# Patient Record
Sex: Male | Born: 1990 | Race: Black or African American | Hispanic: No | Marital: Single | Smoking: Never smoker
Health system: Southern US, Community
[De-identification: ages and names within clinical notes are randomized; demographics above are authoritative.]

## PROBLEM LIST (undated history)

## (undated) VITALS — BP 120/76 | HR 110 | Temp 100.7°F | Resp 16 | Ht 66.0 in | Wt 183.0 lb

## (undated) DIAGNOSIS — F445 Conversion disorder with seizures or convulsions: Secondary | ICD-10-CM

## (undated) DIAGNOSIS — F32A Depression, unspecified: Secondary | ICD-10-CM

## (undated) DIAGNOSIS — K589 Irritable bowel syndrome without diarrhea: Secondary | ICD-10-CM

## (undated) DIAGNOSIS — R55 Syncope and collapse: Secondary | ICD-10-CM

## (undated) DIAGNOSIS — F329 Major depressive disorder, single episode, unspecified: Secondary | ICD-10-CM

## (undated) DIAGNOSIS — F319 Bipolar disorder, unspecified: Secondary | ICD-10-CM

## (undated) DIAGNOSIS — F419 Anxiety disorder, unspecified: Secondary | ICD-10-CM

## (undated) DIAGNOSIS — F988 Other specified behavioral and emotional disorders with onset usually occurring in childhood and adolescence: Secondary | ICD-10-CM

## (undated) HISTORY — DX: Bipolar disorder, unspecified: F31.9

## (undated) HISTORY — PX: OTHER SURGICAL HISTORY: SHX169

## (undated) HISTORY — PX: SHOULDER SURGERY: SHX246

---
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
## 2009-04-12 ENCOUNTER — Emergency Department (HOSPITAL_COMMUNITY): Admission: EM | Admit: 2009-04-12 | Discharge: 2009-04-12 | Payer: Self-pay | Admitting: Emergency Medicine

## 2009-04-15 ENCOUNTER — Encounter: Payer: Self-pay | Admitting: Physician Assistant

## 2009-04-15 ENCOUNTER — Ambulatory Visit: Payer: Self-pay | Admitting: Cardiology

## 2009-04-16 ENCOUNTER — Encounter: Payer: Self-pay | Admitting: Cardiology

## 2009-04-25 ENCOUNTER — Emergency Department (HOSPITAL_COMMUNITY): Admission: EM | Admit: 2009-04-25 | Discharge: 2009-04-25 | Payer: Self-pay | Admitting: Emergency Medicine

## 2009-04-26 DIAGNOSIS — R55 Syncope and collapse: Secondary | ICD-10-CM | POA: Insufficient documentation

## 2009-04-27 ENCOUNTER — Ambulatory Visit: Payer: Self-pay | Admitting: Internal Medicine

## 2009-05-23 ENCOUNTER — Inpatient Hospital Stay (HOSPITAL_COMMUNITY): Admission: EM | Admit: 2009-05-23 | Discharge: 2009-05-28 | Payer: Self-pay | Admitting: Emergency Medicine

## 2009-05-23 ENCOUNTER — Ambulatory Visit: Payer: Self-pay | Admitting: Internal Medicine

## 2009-05-25 ENCOUNTER — Encounter (INDEPENDENT_AMBULATORY_CARE_PROVIDER_SITE_OTHER): Payer: Self-pay | Admitting: Neurology

## 2009-06-09 ENCOUNTER — Ambulatory Visit (HOSPITAL_COMMUNITY): Admission: RE | Admit: 2009-06-09 | Discharge: 2009-06-09 | Payer: Self-pay | Admitting: Internal Medicine

## 2009-09-28 ENCOUNTER — Telehealth (INDEPENDENT_AMBULATORY_CARE_PROVIDER_SITE_OTHER): Payer: Self-pay | Admitting: *Deleted

## 2009-10-03 ENCOUNTER — Emergency Department (HOSPITAL_COMMUNITY): Admission: EM | Admit: 2009-10-03 | Discharge: 2009-10-03 | Payer: Self-pay | Admitting: Emergency Medicine

## 2009-11-25 ENCOUNTER — Emergency Department (HOSPITAL_COMMUNITY): Admission: EM | Admit: 2009-11-25 | Discharge: 2009-11-26 | Payer: Self-pay | Admitting: Emergency Medicine

## 2009-11-26 ENCOUNTER — Inpatient Hospital Stay (HOSPITAL_COMMUNITY): Admission: AD | Admit: 2009-11-26 | Discharge: 2009-11-28 | Payer: Self-pay | Admitting: Psychiatry

## 2009-11-26 ENCOUNTER — Ambulatory Visit: Payer: Self-pay | Admitting: Psychiatry

## 2010-01-07 ENCOUNTER — Telehealth (INDEPENDENT_AMBULATORY_CARE_PROVIDER_SITE_OTHER): Payer: Self-pay | Admitting: *Deleted

## 2010-01-10 ENCOUNTER — Ambulatory Visit: Payer: Self-pay | Admitting: Internal Medicine

## 2010-01-10 ENCOUNTER — Telehealth: Payer: Self-pay | Admitting: Internal Medicine

## 2010-01-10 DIAGNOSIS — F329 Major depressive disorder, single episode, unspecified: Secondary | ICD-10-CM | POA: Insufficient documentation

## 2010-01-10 DIAGNOSIS — J309 Allergic rhinitis, unspecified: Secondary | ICD-10-CM | POA: Insufficient documentation

## 2010-01-10 DIAGNOSIS — F411 Generalized anxiety disorder: Secondary | ICD-10-CM | POA: Insufficient documentation

## 2010-01-10 DIAGNOSIS — G4700 Insomnia, unspecified: Secondary | ICD-10-CM | POA: Insufficient documentation

## 2010-01-10 DIAGNOSIS — M25519 Pain in unspecified shoulder: Secondary | ICD-10-CM | POA: Insufficient documentation

## 2010-01-10 LAB — CONVERTED CEMR LAB
ALT: 121 units/L — ABNORMAL HIGH (ref 0–53)
Albumin: 4.3 g/dL (ref 3.5–5.2)
Basophils Relative: 0.2 % (ref 0.0–3.0)
CO2: 32 meq/L (ref 19–32)
Calcium: 10.3 mg/dL (ref 8.4–10.5)
Chloride: 100 meq/L (ref 96–112)
Creatinine, Ser: 0.8 mg/dL (ref 0.4–1.5)
Eosinophils Relative: 2.1 % (ref 0.0–5.0)
LDL Cholesterol: 88 mg/dL (ref 0–99)
Lymphocytes Relative: 52.6 % — ABNORMAL HIGH (ref 12.0–46.0)
MCHC: 35.3 g/dL (ref 30.0–36.0)
Neutro Abs: 1.5 10*3/uL (ref 1.4–7.7)
Nitrite: NEGATIVE
RBC: 5.09 M/uL (ref 4.22–5.81)
Sodium: 140 meq/L (ref 135–145)
Specific Gravity, Urine: 1.02 (ref 1.000–1.030)
Total Bilirubin: 0.4 mg/dL (ref 0.3–1.2)
Total CHOL/HDL Ratio: 3
Total Protein, Urine: NEGATIVE mg/dL
Urine Glucose: NEGATIVE mg/dL
Urobilinogen, UA: 0.2 (ref 0.0–1.0)
VLDL: 17.8 mg/dL (ref 0.0–40.0)
WBC: 4.4 10*3/uL — ABNORMAL LOW (ref 4.5–10.5)
pH: 8 (ref 5.0–8.0)

## 2010-01-18 ENCOUNTER — Telehealth (INDEPENDENT_AMBULATORY_CARE_PROVIDER_SITE_OTHER): Payer: Self-pay | Admitting: *Deleted

## 2010-01-24 ENCOUNTER — Ambulatory Visit: Payer: Self-pay | Admitting: Professional

## 2010-01-25 ENCOUNTER — Telehealth: Payer: Self-pay | Admitting: Internal Medicine

## 2010-02-14 ENCOUNTER — Telehealth: Payer: Self-pay | Admitting: Internal Medicine

## 2010-03-06 ENCOUNTER — Emergency Department (HOSPITAL_COMMUNITY): Admission: EM | Admit: 2010-03-06 | Discharge: 2010-03-06 | Payer: Self-pay | Admitting: Emergency Medicine

## 2010-03-29 ENCOUNTER — Telehealth (INDEPENDENT_AMBULATORY_CARE_PROVIDER_SITE_OTHER): Payer: Self-pay | Admitting: *Deleted

## 2010-03-30 ENCOUNTER — Emergency Department (HOSPITAL_COMMUNITY): Admission: EM | Admit: 2010-03-30 | Discharge: 2010-03-30 | Payer: Self-pay | Admitting: Emergency Medicine

## 2010-04-04 ENCOUNTER — Telehealth: Payer: Self-pay | Admitting: Internal Medicine

## 2010-04-21 ENCOUNTER — Telehealth: Payer: Self-pay | Admitting: Internal Medicine

## 2010-04-23 ENCOUNTER — Emergency Department (HOSPITAL_COMMUNITY): Admission: EM | Admit: 2010-04-23 | Discharge: 2010-04-23 | Payer: Self-pay | Admitting: Emergency Medicine

## 2010-05-01 ENCOUNTER — Emergency Department (HOSPITAL_COMMUNITY)
Admission: EM | Admit: 2010-05-01 | Discharge: 2010-05-01 | Payer: Self-pay | Source: Home / Self Care | Admitting: Emergency Medicine

## 2010-05-30 ENCOUNTER — Telehealth: Payer: Self-pay | Admitting: Internal Medicine

## 2010-08-10 ENCOUNTER — Emergency Department (HOSPITAL_COMMUNITY)
Admission: EM | Admit: 2010-08-10 | Discharge: 2010-08-10 | Payer: Self-pay | Source: Home / Self Care | Admitting: Emergency Medicine

## 2010-08-11 ENCOUNTER — Telehealth: Payer: Self-pay | Admitting: Internal Medicine

## 2010-08-12 ENCOUNTER — Ambulatory Visit: Payer: Self-pay | Admitting: Internal Medicine

## 2010-08-15 ENCOUNTER — Telehealth: Payer: Self-pay | Admitting: Internal Medicine

## 2010-08-17 ENCOUNTER — Ambulatory Visit: Payer: Self-pay | Admitting: Internal Medicine

## 2010-08-17 DIAGNOSIS — M62838 Other muscle spasm: Secondary | ICD-10-CM | POA: Insufficient documentation

## 2010-08-24 ENCOUNTER — Encounter: Payer: Self-pay | Admitting: Internal Medicine

## 2010-08-26 ENCOUNTER — Telehealth: Payer: Self-pay | Admitting: Internal Medicine

## 2010-08-31 ENCOUNTER — Encounter: Payer: Self-pay | Admitting: Internal Medicine

## 2010-09-01 ENCOUNTER — Encounter: Payer: Self-pay | Admitting: Internal Medicine

## 2010-09-06 ENCOUNTER — Encounter: Payer: Self-pay | Admitting: Internal Medicine

## 2010-10-02 ENCOUNTER — Emergency Department (HOSPITAL_COMMUNITY)
Admission: EM | Admit: 2010-10-02 | Discharge: 2010-10-02 | Payer: Self-pay | Source: Home / Self Care | Admitting: Emergency Medicine

## 2010-10-02 LAB — CBC
MCH: 30.5 pg (ref 26.0–34.0)
MCV: 86 fL (ref 78.0–100.0)
RDW: 12.8 % (ref 11.5–15.5)

## 2010-10-02 LAB — BASIC METABOLIC PANEL
BUN: 14 mg/dL (ref 6–23)
Chloride: 105 mEq/L (ref 96–112)
Creatinine, Ser: 0.91 mg/dL (ref 0.4–1.5)
GFR calc Af Amer: >60 mL/min (ref >60)
Glucose, Bld: 111 mg/dL — ABNORMAL HIGH (ref 70–99)
Potassium: 4.1 mEq/L (ref 3.5–5.1)
Sodium: 140 mEq/L (ref 135–145)

## 2010-10-02 LAB — DIFFERENTIAL
Basophils Absolute: 0 10*3/uL (ref 0.0–0.1)
Monocytes Absolute: 0.5 10*3/uL (ref 0.1–1.0)
Monocytes Relative: 8 % (ref 3–12)
Neutrophils Relative %: 52 % (ref 43–77)

## 2010-10-02 LAB — URINALYSIS, ROUTINE W REFLEX MICROSCOPIC
Hgb urine dipstick: NEGATIVE
Ketones, ur: NEGATIVE mg/dL

## 2010-10-02 LAB — RAPID URINE DRUG SCREEN, HOSP PERFORMED
Amphetamines: NOT DETECTED
Barbiturates: NOT DETECTED
Benzodiazepines: POSITIVE — AB
Cocaine: NOT DETECTED
Tetrahydrocannabinol: NOT DETECTED

## 2010-10-03 ENCOUNTER — Encounter: Payer: Self-pay | Admitting: Internal Medicine

## 2010-10-04 NOTE — Progress Notes (Signed)
Summary: Celexa refill  Phone Note Call from Patient   Caller: Mom [phone] Summary of Call: pt's mother called requesting Rx for Celexa. Per mom, pt will not get an appt with Psychiatry or Psychologist for at least 30 days and will run out by then. Initial call taken by: Margaret Pyle, CMA,  Jan 10, 2010 11:39 AM  Follow-up for Phone Call        ok for rx - to robin to handle Follow-up by: Corwin Levins MD,  Jan 10, 2010 12:51 PM    Prescriptions: CITALOPRAM HYDROBROMIDE 20 MG TABS (CITALOPRAM HYDROBROMIDE) 1po once daily  #30 x 0   Entered by:   Scharlene Gloss   Authorized by:   Corwin Levins MD   Signed by:   Scharlene Gloss on 01/10/2010   Method used:   [facility] ...       [facility]  [address] [id number]       [address]       [place], [place]  [address]       Ph: [phone]       Fax: [phone]   RxID:   [id number]

## 2010-10-04 NOTE — Progress Notes (Signed)
  Phone Note Other Incoming   Request: Send information Summary of Call: Request received from [facility] forwarded to [employer].       

## 2010-10-04 NOTE — Progress Notes (Signed)
  Phone Note Other Incoming   Request: Send information Summary of Call: Request for records received from [facility]. Request forwarded to [employer].     

## 2010-10-04 NOTE — Progress Notes (Signed)
Summary: PA, RXd at ER  Phone Note Call from Patient Call back at [facility] Phone [phone]   Caller: Yolanda--[phone] Call For: Brandon Levins MD Summary of Call: Pt seen at [facility] for seizures.They prescribed Ambien CR for sleep. In order for it to be filled it has to have prior auth. Pt cannot bring pt in for appt 12/19 for post hospital f/u. Mom wants to know if he Dr Jonny Ruiz can authorize this if not work in today for appt. Initial call taken by: Verdell Face,  August 11, 2010 10:59 AM  Follow-up for Phone Call        ok to start the PA for this Follow-up by: Brandon Levins MD,  August 11, 2010 12:07 PM  Additional Follow-up for Phone Call Additional follow up Details #1::        Dr Jonny Ruiz, i can not start a PA on a prescription that you  did not prescribe for patient. Additional Follow-up by: Dagoberto Reef,  August 11, 2010 2:44 PM    Additional Follow-up for Phone Call Additional follow up Details #2::    done hardcopy to LIM side B - dahlia  Follow-up by: Brandon Levins MD,  August 11, 2010 4:17 PM  Additional Follow-up for Phone Call Additional follow up Details #3:: Details for Additional Follow-up Action Taken: After advising JWJ that pt's mother verified that she will bring pt in for appt sched for 12/09 at 12noon, MD decided to wait on faxing in Rx to pt's pharmacy until eval at [facility]. Additional Follow-up by: Margaret Pyle, CMA,  August 11, 2010 4:55 PM  New/Updated Medications: AMBIEN CR 12.5 MG CR-TABS (ZOLPIDEM TARTRATE) 1po at bedtime as needed Prescriptions: AMBIEN CR 12.5 MG CR-TABS (ZOLPIDEM TARTRATE) 1po at bedtime as needed  #30 x 5   Entered and Authorized by:   Brandon Levins MD   Signed by:   Brandon Levins MD on 08/11/2010   Method used:   Print then Give to Patient   RxID:   [id number]

## 2010-10-04 NOTE — Progress Notes (Signed)
  Phone Note Refill Request Message from:  Fax from Pharmacy on April 04, 2010 9:55 AM  Refills Requested: Medication #1:  ABILIFY 5 MG TABS 1 by mouth once daily   Dosage confirmed as above?Dosage Confirmed   Last Refilled: 01/10/2010   Notes: [facility] [address] [place], [facility]OX#[phone] Initial call taken by: Robin Ewing CMA Duncan Dull),  April 04, 2010 9:55 AM    Prescriptions: ABILIFY 5 MG TABS (ARIPIPRAZOLE) 1 by mouth once daily  #30 x 3   Entered by:   Scharlene Gloss CMA (AAMA)   Authorized by:   Corwin Levins MD   Signed by:   Scharlene Gloss CMA (AAMA) on 04/04/2010   Method used:   F[facility]ed to ...       [facility]  [address] [id number]ail[facility]       [address]       [place], [place]  [address]       Ph: [phone] or [phone]       Fax: [phone]   RxID:   [id number]

## 2010-10-04 NOTE — Progress Notes (Signed)
Summary: 90 day Rx req  Phone Note Refill Request Message from:  Pharmacy on April 04, 2010 1:23 PM  Refills Requested: Medication #1:  ABILIFY 5 MG TABS 1 by mouth once daily   Dosage confirmed as above?Dosage Confirmed   Supply Requested: 9 months Pharmacy called stating that pt's insurance will only pay for 90 day supply of this medication.   Method Requested: Electronic Initial call taken by: Margaret Pyle, CMA,  April 04, 2010 1:24 PM    Prescriptions: ABILIFY 5 MG TABS (ARIPIPRAZOLE) 1 by mouth once daily  #90 x 2   Entered by:   Margaret Pyle, CMA   Authorized by:   Corwin Levins MD   Signed by:   Margaret Pyle, CMA on 04/04/2010   Method used:   [facility]ronically to        [facility]  [address] [id number]retail[facility]       [address]       [place], [place]  [address]       Ph: [phone] or [phone]       Fax: [phone]   RxID:   [id number]

## 2010-10-04 NOTE — Assessment & Plan Note (Signed)
Summary: NEW [employer] PT--PKG--#--STC   Vital Signs:  Patient profile:   20 year old male Height:      66 inches Weight:      184.75 pounds BMI:     29.93 O2 Sat:      98 % on Room air Temp:     97.8 degrees F oral Pulse rate:   63 / minute BP sitting:   100 / 62  (left arm) Cuff size:   regular  Vitals Entered ByZella Ball Ewing (Jan 10, 2010 9:49 AM)  O2 Flow:  Room air CC: New Patient, New BCBS/RE   Primary Care Provider:  Dr.Tapper  CC:  New Patient and New BCBS/RE.  History of Present Illness: after seeing neurology [place], was rec'd to psychiatry and psychology, but needs referrals;  takes up tot 2 trazodone per night but still not working well;  celexa "works sometimes"   - has good compliance but tends to still have significant depressive symtpoms later in the day; not sleepy later in the day;  tends to stay up most nights to 6 am, and sleeps until 11am;  Pt denies CP, sob, doe, wheezing, orthopnea, pnd, worsening LE edema, palps, dizziness or syncope  Pt denies new neuro symptoms such as headache, facial or extremity weakness  Still has some mild bilat shoudler aches .    Problems Prior to Update: 1)  Preventive Health Care  (ICD-V70.0) 2)  Insomnia-sleep Disorder-unspec  (ICD-780.52) 3)  Shoulder Pain, Bilateral  (ICD-719.41) 4)  Anxiety  (ICD-300.00) 5)  Allergic Rhinitis  (ICD-477.9) 6)  Depression  (ICD-311) 7)  Syncope  (ICD-780.2)  Medications Prior to Update: 1)  None 2)  Tramadol Hcl 50 Mg Tabs (Tramadol Hcl) .... As Needed  Current Medications (verified): 1)  None 2)  Citalopram Hydrobromide 20 Mg Tabs (Citalopram Hydrobromide) .Marland Kitchen.. 1po Once Daily 3)  Abilify 5 Mg Tabs (Aripiprazole) .Marland Kitchen.. 1 By Mouth Once Daily 4)  Zolpidem Tartrate 10 Mg Tabs (Zolpidem Tartrate) .Marland Kitchen.. 1 By Mouth At Bedtime As Needed 5)  Meloxicam 15 Mg Tabs (Meloxicam) .Marland Kitchen.. 1 By Mouth Once Daily As Needed  Allergies (verified): No Known Drug Allergies  Past History:  Family  History: Last updated: 04/27/2009 No premature coronary disease or sudden death.  Both parents are alive and well.  Social History: Last updated: 01/10/2010 Tobacco Use - No.  Alcohol Use - no Drug Use - no lives with mother work  - unemployed taking online classes  Risk Factors: Smoking Status: never (04/26/2009)  Past Medical History: SYNCOPE (ICD-780.2) x 2 - aug 2010 ? 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 Right Shoulder Surgery r/t mult dislocations  Family History: Reviewed history from 04/27/2009 and no changes required. No premature coronary disease or sudden death.  Both parents are alive and well.  Social History: Reviewed history from 04/26/2009 and no changes required. Tobacco Use - No.  Alcohol Use - no Drug Use - no lives with mother work  - unemployed taking online classes  Review of Systems  The patient denies anorexia, fever, weight loss, weight gain, vision loss, decreased hearing, hoarseness, chest pain, syncope, dyspnea on exertion, peripheral edema, prolonged cough, headaches, hemoptysis, abdominal pain, melena, hematochezia, severe indigestion/heartburn, hematuria, muscle weakness, suspicious skin lesions, transient blindness, difficulty walking, depression, unusual weight change, abnormal  bleeding, enlarged lymph nodes, and angioedema.         all otherwise negative per pt -    Physical Exam  General:  alert and overweight-appearing - mild, but also musclar/athletic Head:  normocephalic and atraumatic.   Eyes:  vision grossly intact, pupils equal, and pupils round.   Ears:  R ear normal and L ear normal.   Nose:  no external deformity and no nasal discharge.   Mouth:  no gingival abnormalities and pharynx pink and  moist.   Neck:  supple and no masses.   Lungs:  normal respiratory effort and normal breath sounds.   Heart:  normal rate and regular rhythm.   Abdomen:  soft, non-tender, and normal bowel sounds.   Msk:  no joint tenderness and no joint swelling.   Extremities:  no edema, no erythema  Neurologic:  cranial nerves II-XII intact and strength normal in all extremities.     Impression & Recommendations:  Problem # 1:  Preventive Health Care (ICD-V70.0)  Overall doing well, age appropriate education and counseling updated and referral for appropriate preventive services done unless declined, immunizations up to date or declined, diet counseling done if overweight, urged to quit smoking if smokes , most recent labs reviewed and current ordered if appropriate, ecg reviewed or declined (interpretation per ECG scanned in the EMR if done); information regarding Medicare Prevention requirements given if appropriate; speciality referrals updated as appropriate   Orders: TLB-BMP (Basic Metabolic Panel-BMET) (80048-METABOL) TLB-CBC Platelet - w/Differential (85025-CBCD) TLB-Hepatic/Liver Function Pnl (80076-HEPATIC) TLB-Lipid Panel (80061-LIPID) TLB-TSH (Thyroid Stimulating Hormone) (84443-TSH) TLB-Udip ONLY (81003-UDIP)  Problem # 2:  SHOULDER PAIN, BILATERAL (ICD-719.41)  The following medications were removed from the medication list:    Tramadol Hcl 50 Mg Tabs (Tramadol hcl) .Marland Kitchen... As needed His updated medication list for this problem includes:    Meloxicam 15 Mg Tabs (Meloxicam) .Marland Kitchen... 1 by mouth once daily as needed chronic bilat, right > left - for nsaid as needed   Problem # 3:  DEPRESSION (ICD-311)  The following medications were removed from the medication list:    Trazodone Hcl 50 Mg Tabs (Trazodone hcl) .Marland Kitchen... 1 by mouth at bedtime His updated medication list for this problem includes:    Citalopram Hydrobromide 20 Mg Tabs (Citalopram hydrobromide) .Marland Kitchen... 1po once  daily  Orders: Psychology Referral (Psychology) Psychiatric Referral (Psych)  Problem # 4:  INSOMNIA-SLEEP DISORDER-UNSPEC (ICD-780.52)  for ambien as needed   His updated medication list for this problem includes:    Zolpidem Tartrate 10 Mg Tabs (Zolpidem tartrate) .Marland Kitchen... 1 by mouth at bedtime as needed  Complete Medication List: 1)  None  2)  Citalopram Hydrobromide 20 Mg Tabs (Citalopram hydrobromide) .Marland Kitchen.. 1po once daily 3)  Abilify 5 Mg Tabs (Aripiprazole) .Marland Kitchen.. 1 by mouth once daily 4)  Zolpidem Tartrate 10 Mg Tabs (Zolpidem tartrate) .Marland Kitchen.. 1 by mouth at bedtime as needed 5)  Meloxicam 15 Mg Tabs (Meloxicam) .Marland Kitchen.. 1 by mouth once daily as needed  Patient Instructions: 1)  stop the trazodone 2)  start the generic ambien for sleep as needed 3)  start the meloxicam for pain as needed  4)  start the abilify for depression - remember only take HALF of the pill per day 5)  Continue all previous medications as before this visit  6)  You will be contacted about the referral(s) to: psychology and psychiatry 7)  Please schedule a follow-up appointment in 1 year or sooner if needed Prescriptions: MELOXICAM 15 MG TABS (  MELOXICAM) 1 by mouth once daily as needed  #30 x 11   Entered and Authorized by:   Corwin Levins MD   Signed by:   Corwin Levins MD on 01/10/2010   Method used:   Print then Give to Patient   RxID:   [id number] ZOLPIDEM TARTRATE 10 MG TABS (ZOLPIDEM TARTRATE) 1 by mouth at bedtime as needed  #30 x 5   Entered and Authorized by:   Corwin Levins MD   Signed by:   Corwin Levins MD on 01/10/2010   Method used:   Print then Give to Patient   RxID:   [id number] ABILIFY 5 MG TABS (ARIPIPRAZOLE) 1 by mouth once daily  #30 x 3   Entered and Authorized by:   Corwin Levins MD   Signed by:   Corwin Levins MD on 01/10/2010   Method used:   Print then Give to Patient   RxID:   [id number]

## 2010-10-04 NOTE — Progress Notes (Signed)
  Phone Note From Pharmacy   [facility]  [address] [id number] Summary of Call: Pharmacy received prescription for Citalopram 20mg  #30 with 5 refills on 02/14/2010. Insurance requires 90 day supply, they need a new rx. Initial call taken by: Robin Ewing CMA Duncan Dull),  April 21, 2010 1:39 PM  Follow-up for Phone Call        ok to change to 90 days rx - to robin to handle Follow-up by: Corwin Levins MD,  April 21, 2010 1:50 PM    Prescriptions: CITALOPRAM HYDROBROMIDE 20 MG TABS (CITALOPRAM HYDROBROMIDE) 1po once daily  #90 x 2   Entered by:   Scharlene Gloss CMA (AAMA)   Authorized by:   Corwin Levins MD   Signed by:   Scharlene Gloss CMA (AAMA) on 04/21/2010   Method used:   Faxed to ...       [facility]  [address] [id number]ail)       [address]       [place], [place]  [address]       Ph: [phone] or [phone]       Fax: [phone]   RxID:   [id number]

## 2010-10-04 NOTE — Progress Notes (Signed)
Summary: Referral/Medication  Phone Note Call from Patient   Caller: Argentina Ponder [phone] Summary of Call: pt's mother called stating that she was unsatisfied with Psychology appt yesterday 01/24/2010 with Dr. Luiz Blare. Pt's mother says that Dr feel asleep several times during visit and continued to repeat himself. Pt and mother are requesting referral to different MD. Mother also states that pt has been doing "things" at night that he does not remember since starting Ambien, she is requesting alt medication. 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 Additional Follow-up by: Margaret Pyle, CMA,  Jan 25, 2010 2:58 PM    Additional Follow-up for Phone Call Additional follow up Details #2::    ok to try temazepam  - done hardcopy to LIM side B - dahlia  Follow-up by: Corwin Levins MD,  Jan 26, 2010 5:22 PM  Additional Follow-up for Phone Call Additional follow up Details #3:: Details for Additional Follow-up Action Taken: Rx in cabinet for pt pick up. Pt's mother informed Additional Follow-up by: Margaret Pyle, CMA,  Jan 27, 2010 8:06 AM  New/Updated Medications: TRAZODONE HCL 50 MG TABS (TRAZODONE HCL) 1 by mouth at bedtime as needed sleep TEMAZEPAM 30 MG CAPS (TEMAZEPAM) 1po at bedtime as needed Prescriptions: TEMAZEPAM 30 MG CAPS (TEMAZEPAM) 1po at bedtime as needed  #30 x 2   Entered and Authorized by:   Corwin Levins MD   Signed by:   Corwin Levins MD on 01/26/2010   Method used:   Print then Give  to Patient   RxID:   [id number] TRAZODONE HCL 50 MG TABS (TRAZODONE HCL) 1 by mouth at bedtime as needed sleep  #30 x 5   Entered and Authorized by:   Corwin Levins MD   Signed by:   Corwin Levins MD on 01/25/2010   Method used:   Print then Give to Patient   RxID:   [id number]  done hardcopy to LIM side B - dahlia Corwin Levins MD  Jan 25, 2010 2:05 PM

## 2010-10-04 NOTE — Progress Notes (Signed)
Summary: medication refill  Phone Note Refill Request Message from:  Fax from Pharmacy on May 30, 2010 9:43 AM  Refills Requested: Medication #1:  TEMAZEPAM 30 MG CAPS 1po at bedtime as needed   Dosage confirmed as above?Dosage Confirmed   Last Refilled: 01/26/2010   Notes: [facility] [address] [place], [facility]NA[facility][phone] Initial call taken by: Robin Ewing CMA Duncan Dull),  May 30, 2010 9:44 AM    Prescriptions: TEMAZEPAM 30 MG CAPS (TEMAZEPAM) 1po at bedtime as needed  #30 x 2   Entered and Authorized by:   Corwin Levins MD   Signed by:   Corwin Levins MD on 05/30/2010   Method used:   Print then Give to Patient   RxID:   [id number]  done hardcopy to LIM side B - dahlia Corwin Levins MD  May 30, 2010 2:41 PM   Rx faxed to pharmacy Margaret Pyle, CMA  May 30, 2010 2:55 PM

## 2010-10-04 NOTE — Progress Notes (Signed)
Summary: Rx refill req  Phone Note Call from Patient   Caller: Mom [phone] Summary of Call: pt's mother called requesting [facility]ills of [facility]lexa [facility] Initial call taken by: Margaret Pyle, CMA,  February 14, 2010 11:37 AM    Prescriptions: CITALOPRAM HYDROBROMIDE 20 MG TABS (CITALOPRAM HYDROBROMIDE) 1po once daily  #30 x 5   Entered by:   Margaret Pyle, CMA   Authorized by:   Corwin Levins MD   Signed by:   Margaret Pyle, CMA on 02/14/2010   Method used:   Electronically to        [facility]  [address] [facility] (retail)       [address]       [place], [place]  [address]       Ph: [phone] or [phone]       Fax: [phone]   RxID:   [id number]

## 2010-10-04 NOTE — Progress Notes (Signed)
  Request for Records from [facility] forwarded to [facility] for processing [facility]  September 28, 2009 11:49 AM

## 2010-10-06 NOTE — Letter (Signed)
Summary: [facility]  [facility]   Imported By: Lennie Odor 09/12/2010 11:14:29  _____________________________________________________________________  External Attachment:    Type:   Image     Comment:   External Document

## 2010-10-06 NOTE — Medication Information (Signed)
Summary: Ambien CR approved/[facility]  Ambien CR approved/[facility]   Imported By: Sherian Rein 09/07/2010 09:55:17  _____________________________________________________________________  External Attachment:    Type:   Image     Comment:   External Document

## 2010-10-06 NOTE — Progress Notes (Signed)
Summary: PA-Zolpidem  Phone Note From Pharmacy   Call[facility]: [facility]  [address] [phone]* Summary of Call: PA -Zolpidem Called [facility] @ [phone], received form. form to Dr Jonny Ruiz to complete. Dagoberto Reef  August 26, 2010 2:26 PM   Follow-up for Phone Call        form completed and faxed to [facility] Follow-up by: Margaret Pyle, CMA,  August 31, 2010 2:15 PM  Additional Follow-up for Phone Call Additional follow up Details #1::        PA Approved 09/01/2010 -  03/03/2011 Additional Follow-up by: Margaret Pyle, CMA,  September 01, 2010 8:39 AM

## 2010-10-06 NOTE — Medication Information (Signed)
Summary: Ambien/[facility]  Ambien/[facility]   Imported By: Sherian Rein 09/06/2010 12:21:25  _____________________________________________________________________  External Attachment:    Type:   Image     Comment:   External Document

## 2010-10-06 NOTE — Letter (Signed)
Summary: [facility]  [facility]   Imported By: Lennie Odor 09/13/2010 12:09:13  _____________________________________________________________________  External Attachment:    Type:   Image     Comment:   External Document
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
## 2010-10-06 NOTE — Letter (Signed)
Summary: [facility]  [facility]   Imported By: Lennie Odor 09/28/2010 15:14:57  _____________________________________________________________________  External Attachment:    Type:   Image     Comment:   External Document

## 2010-10-06 NOTE — Progress Notes (Signed)
Summary: Ambien PA  Phone Note Call from Patient   Caller: Mom Summary of Call: Pt's mother called stating pt is Inpt at [facility] and will not be able to keep re-schd appt for tomorrow. Pt's mother did request PA be done on Ambien for pt ASAP because she was advised in hospital by nurse that Ambien 10mg  is not availbale (pt is being given Ambien 5mg ) but pt can bring in his own medicine and take with meds given at hospital. I advised pt's mother that MD decided to hold off on PA because pt was scheduled for appt and if he is a pt at [facility] she should advise attending MD that he is not resting with current Ambien dosage. Mother agreed and will call back to sch pt to have post-hosp once released. Initial call taken by: Margaret Pyle, CMA,  August 15, 2010 4:45 PM
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
## 2010-10-25 ENCOUNTER — Encounter: Payer: Self-pay | Admitting: Internal Medicine

## 2010-11-01 NOTE — Consult Note (Signed)
Summary: [facility] / [facility]  [facility] / [facility]   Imported By: Lennie Odor 10/25/2010 10:20:10  _____________________________________________________________________  External Attachment:    Type:   Image     Comment:   External Document

## 2010-11-10 NOTE — Letter (Signed)
Summary: Cardiology/[facility]  Cardiology/[facility]   Imported By: Sherian Rein 11/02/2010 08:50:09  _____________________________________________________________________  External Attachment:    Type:   Image     Comment:   External Document

## 2010-11-15 LAB — RAPID URINE DRUG SCREEN, HOSP PERFORMED
Amphetamines: NOT DETECTED
Barbiturates: POSITIVE — AB
Benzodiazepines: NOT DETECTED

## 2010-11-15 LAB — POCT I-STAT, CHEM 8
BUN: 11 mg/dL (ref 6–23)
Calcium, Ion: 1.14 mmol/L (ref 1.12–1.32)
Chloride: 107 mEq/L (ref 96–112)

## 2010-11-15 LAB — URINE MICROSCOPIC-ADD ON

## 2010-11-15 LAB — URINALYSIS, ROUTINE W REFLEX MICROSCOPIC
Bilirubin Urine: NEGATIVE
Hgb urine dipstick: NEGATIVE
Protein, ur: 100 mg/dL — AB
Urobilinogen, UA: 0.2 mg/dL (ref 0.0–1.0)

## 2010-11-15 LAB — ETHANOL: Alcohol, Ethyl (B): <5 mg/dL (ref 0–10)

## 2010-11-18 LAB — COMPREHENSIVE METABOLIC PANEL
ALT: 54 U/L — ABNORMAL HIGH (ref 0–53)
AST: 31 U/L (ref 0–37)
Calcium: 9.4 mg/dL (ref 8.4–10.5)
GFR calc Af Amer: >60 mL/min (ref >60)
Sodium: 134 mEq/L — ABNORMAL LOW (ref 135–145)
Total Protein: 7.4 g/dL (ref 6.0–8.3)

## 2010-11-18 LAB — POCT I-STAT, CHEM 8
Calcium, Ion: 1.12 mmol/L (ref 1.12–1.32)
Chloride: 109 mEq/L (ref 96–112)
HCT: 48 % (ref 39.0–52.0)
TCO2: 25 mmol/L (ref 0–100)

## 2010-11-18 LAB — URINALYSIS, ROUTINE W REFLEX MICROSCOPIC
Ketones, ur: NEGATIVE mg/dL
Nitrite: NEGATIVE
Protein, ur: NEGATIVE mg/dL
Urobilinogen, UA: 1 mg/dL (ref 0.0–1.0)
pH: 6.5 (ref 5.0–8.0)

## 2010-11-18 LAB — CBC
MCHC: 35.5 g/dL (ref 30.0–36.0)
Platelets: 229 10*3/uL (ref 150–400)
RDW: 12.4 % (ref 11.5–15.5)

## 2010-11-18 LAB — DIFFERENTIAL
Eosinophils Absolute: 0.1 10*3/uL (ref 0.0–0.7)
Eosinophils Relative: 1 % (ref 0–5)
Lymphocytes Relative: 45 % (ref 12–46)
Lymphs Abs: 2.5 10*3/uL (ref 0.7–4.0)
Monocytes Relative: 8 % (ref 3–12)

## 2010-11-20 LAB — DIFFERENTIAL
Basophils Absolute: 0 10*3/uL (ref 0.0–0.1)
Basophils Absolute: 0 10*3/uL (ref 0.0–0.1)
Basophils Relative: 0 % (ref 0–1)
Eosinophils Absolute: 0.1 10*3/uL (ref 0.0–0.7)
Eosinophils Absolute: 0.1 10*3/uL (ref 0.0–0.7)
Eosinophils Relative: 2 % (ref 0–5)
Lymphocytes Relative: 52 % — ABNORMAL HIGH (ref 12–46)
Monocytes Absolute: 0.4 10*3/uL (ref 0.1–1.0)
Monocytes Relative: 9 % (ref 3–12)
Neutro Abs: 2.9 10*3/uL (ref 1.7–7.7)
Neutrophils Relative %: 46 % (ref 43–77)

## 2010-11-20 LAB — CBC
HCT: 45.3 % (ref 39.0–52.0)
HCT: 46.9 % (ref 39.0–52.0)
Hemoglobin: 15.9 g/dL (ref 13.0–17.0)
Hemoglobin: 16.1 g/dL (ref 13.0–17.0)
MCHC: 35.2 g/dL (ref 30.0–36.0)
MCHC: 34.3 g/dL (ref 30.0–36.0)
MCV: 90.2 fL (ref 78.0–100.0)
MCV: 92.1 fL (ref 78.0–100.0)
Platelets: 218 10*3/uL (ref 150–400)
RDW: 13 % (ref 11.5–15.5)
RDW: 12.9 % (ref 11.5–15.5)
WBC: 6.3 10*3/uL (ref 4.0–10.5)

## 2010-11-20 LAB — POCT I-STAT, CHEM 8
BUN: 10 mg/dL (ref 6–23)
Calcium, Ion: 1.22 mmol/L (ref 1.12–1.32)
Hemoglobin: 16 g/dL (ref 13.0–17.0)
Sodium: 138 mEq/L (ref 135–145)
TCO2: 27 mmol/L (ref 0–100)

## 2010-11-20 LAB — COMPREHENSIVE METABOLIC PANEL
ALT: 28 U/L (ref 0–53)
Alkaline Phosphatase: 148 U/L — ABNORMAL HIGH (ref 39–117)
BUN: 16 mg/dL (ref 6–23)
CO2: 26 mEq/L (ref 19–32)
GFR calc non Af Amer: >60 mL/min (ref >60)
Glucose, Bld: 101 mg/dL — ABNORMAL HIGH (ref 70–99)
Potassium: 3.6 mEq/L (ref 3.5–5.1)
Total Bilirubin: 0.3 mg/dL (ref 0.3–1.2)
Total Protein: 7.3 g/dL (ref 6.0–8.3)

## 2010-11-20 LAB — RAPID URINE DRUG SCREEN, HOSP PERFORMED
Barbiturates: NOT DETECTED
Cocaine: NOT DETECTED
Opiates: NOT DETECTED

## 2010-11-21 ENCOUNTER — Emergency Department (HOSPITAL_COMMUNITY)
Admission: EM | Admit: 2010-11-21 | Discharge: 2010-11-21 | Disposition: A | Discharge status: Home / Self Care | Payer: Self-pay | Attending: Emergency Medicine | Admitting: Emergency Medicine

## 2010-11-21 DIAGNOSIS — G40909 Epilepsy, unspecified, not intractable, without status epilepticus: Secondary | ICD-10-CM | POA: Insufficient documentation

## 2010-11-21 DIAGNOSIS — R55 Syncope and collapse: Secondary | ICD-10-CM | POA: Insufficient documentation

## 2010-11-21 LAB — DIFFERENTIAL
Basophils Absolute: 0 10*3/uL (ref 0.0–0.1)
Basophils Relative: 0 % (ref 0–1)
Eosinophils Absolute: 0.2 10*3/uL (ref 0.0–0.7)
Monocytes Absolute: 0.7 10*3/uL (ref 0.1–1.0)
Monocytes Relative: 10 % (ref 3–12)
Neutro Abs: 3.7 10*3/uL (ref 1.7–7.7)
Neutrophils Relative %: 49 % (ref 43–77)

## 2010-11-21 LAB — COMPREHENSIVE METABOLIC PANEL
ALT: 53 U/L (ref 0–53)
AST: 30 U/L (ref 0–37)
Albumin: 4.2 g/dL (ref 3.5–5.2)
Calcium: 9.7 mg/dL (ref 8.4–10.5)
Creatinine, Ser: 1.08 mg/dL (ref 0.4–1.5)
GFR calc Af Amer: >60 mL/min (ref >60)
Sodium: 140 mEq/L (ref 135–145)

## 2010-11-21 LAB — POCT CARDIAC MARKERS
CKMB, poc: 2.7 ng/mL (ref 1.0–8.0)
Troponin i, poc: <0.05 ng/mL (ref 0.00–0.09)

## 2010-11-21 LAB — CBC
Hemoglobin: 15.6 g/dL (ref 13.0–17.0)
MCH: 30.6 pg (ref 26.0–34.0)
MCHC: 35.5 g/dL (ref 30.0–36.0)
Platelets: 257 10*3/uL (ref 150–400)
RBC: 5.09 MIL/uL (ref 4.22–5.81)
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
## 2010-11-28 ENCOUNTER — Other Ambulatory Visit: Payer: Self-pay

## 2010-11-28 DIAGNOSIS — F419 Anxiety disorder, unspecified: Secondary | ICD-10-CM

## 2010-11-28 LAB — DIFFERENTIAL
Basophils Absolute: 0 10*3/uL (ref 0.0–0.1)
Lymphocytes Relative: 31 % (ref 12–46)
Lymphs Abs: 2.6 10*3/uL (ref 0.7–4.0)
Monocytes Absolute: 0.6 10*3/uL (ref 0.1–1.0)
Neutro Abs: 5.1 10*3/uL (ref 1.7–7.7)

## 2010-11-28 LAB — CBC
Hemoglobin: 17.2 g/dL — ABNORMAL HIGH (ref 13.0–17.0)
RBC: 5.43 MIL/uL (ref 4.22–5.81)
RDW: 13.6 % (ref 11.5–15.5)
WBC: 8.4 10*3/uL (ref 4.0–10.5)

## 2010-11-28 LAB — BASIC METABOLIC PANEL
Calcium: 9.8 mg/dL (ref 8.4–10.5)
GFR calc Af Amer: >60 mL/min (ref >60)
GFR calc non Af Amer: >60 mL/min (ref >60)
Glucose, Bld: 101 mg/dL — ABNORMAL HIGH (ref 70–99)
Potassium: 3.9 mEq/L (ref 3.5–5.1)
Sodium: 138 mEq/L (ref 135–145)

## 2010-11-28 LAB — RAPID URINE DRUG SCREEN, HOSP PERFORMED
Benzodiazepines: POSITIVE — AB
Cocaine: NOT DETECTED
Opiates: NOT DETECTED
Tetrahydrocannabinol: NOT DETECTED

## 2010-11-28 LAB — ETHANOL: Alcohol, Ethyl (B): <5 mg/dL (ref 0–10)

## 2010-11-29 MED ORDER — CLONAZEPAM 0.5 MG PO TABS: 0.5000 mg | ORAL_TABLET | Freq: Two times a day (BID) | ORAL | Status: DC

## 2010-11-29 NOTE — Telephone Encounter (Signed)
Done hardcopy to dahlia/LIM B  

## 2010-11-29 NOTE — Telephone Encounter (Signed)
Rx faxed to pharamacy 

## 2010-12-08 LAB — HEPATIC FUNCTION PANEL
ALT: 41 U/L (ref 0–53)
AST: 25 U/L (ref 0–37)
Bilirubin, Direct: 0.2 mg/dL (ref 0.0–0.3)
Total Bilirubin: 0.8 mg/dL (ref 0.3–1.2)

## 2010-12-08 LAB — TSH: TSH: 1.558 u[IU]/mL (ref 0.700–6.400)

## 2010-12-09 LAB — COMPREHENSIVE METABOLIC PANEL
Albumin: 3.9 g/dL (ref 3.5–5.2)
Albumin: 4 g/dL (ref 3.5–5.2)
Alkaline Phosphatase: 155 U/L — ABNORMAL HIGH (ref 39–117)
BUN: 12 mg/dL (ref 6–23)
BUN: 14 mg/dL (ref 6–23)
Calcium: 9.6 mg/dL (ref 8.4–10.5)
Creatinine, Ser: 0.84 mg/dL (ref 0.4–1.5)
Glucose, Bld: 87 mg/dL (ref 70–99)
Glucose, Bld: 89 mg/dL (ref 70–99)
Potassium: 4.1 mEq/L (ref 3.5–5.1)
Sodium: 138 mEq/L (ref 135–145)
Total Bilirubin: 0.6 mg/dL (ref 0.3–1.2)
Total Protein: 6.9 g/dL (ref 6.0–8.3)
Total Protein: 6.9 g/dL (ref 6.0–8.3)

## 2010-12-09 LAB — POCT CARDIAC MARKERS: Myoglobin, poc: 120 ng/mL (ref 12–200)

## 2010-12-09 LAB — POCT I-STAT, CHEM 8
BUN: 14 mg/dL (ref 6–23)
Hemoglobin: 15.3 g/dL (ref 13.0–17.0)
Sodium: 141 mEq/L (ref 135–145)
TCO2: 25 mmol/L (ref 0–100)

## 2010-12-09 LAB — CBC
HCT: 43.7 % (ref 39.0–52.0)
HCT: 45.6 % (ref 39.0–52.0)
Hemoglobin: 15.9 g/dL (ref 13.0–17.0)
MCHC: 34.9 g/dL (ref 30.0–36.0)
MCV: 91.6 fL (ref 78.0–100.0)
Platelets: 238 10*3/uL (ref 150–400)
Platelets: 241 10*3/uL (ref 150–400)
RDW: 12.5 % (ref 11.5–15.5)
RDW: 12.6 % (ref 11.5–15.5)

## 2010-12-09 LAB — HEPATITIS PANEL, ACUTE
Hep A IgM: NEGATIVE
Hep B C IgM: NEGATIVE
Hepatitis B Surface Ag: NEGATIVE

## 2010-12-09 LAB — URINALYSIS, ROUTINE W REFLEX MICROSCOPIC
Glucose, UA: NEGATIVE mg/dL
Hgb urine dipstick: NEGATIVE
Ketones, ur: NEGATIVE mg/dL
Protein, ur: NEGATIVE mg/dL

## 2010-12-09 LAB — GLUCOSE, CAPILLARY
Glucose-Capillary: 86 mg/dL (ref 70–99)
Glucose-Capillary: 90 mg/dL (ref 70–99)

## 2010-12-09 LAB — RAPID URINE DRUG SCREEN, HOSP PERFORMED
Benzodiazepines: NOT DETECTED
Tetrahydrocannabinol: NOT DETECTED

## 2010-12-09 LAB — DIFFERENTIAL
Basophils Absolute: 0 10*3/uL (ref 0.0–0.1)
Eosinophils Absolute: 0 10*3/uL (ref 0.0–0.7)
Eosinophils Relative: 0 % (ref 0–5)
Lymphocytes Relative: 21 % (ref 12–46)
Neutrophils Relative %: 71 % (ref 43–77)

## 2010-12-10 LAB — GLUCOSE, CAPILLARY: Glucose-Capillary: 132 mg/dL — ABNORMAL HIGH (ref 70–99)

## 2010-12-11 LAB — URINALYSIS, ROUTINE W REFLEX MICROSCOPIC
Bilirubin Urine: NEGATIVE
Glucose, UA: NEGATIVE mg/dL
Hgb urine dipstick: NEGATIVE
Ketones, ur: NEGATIVE mg/dL
Protein, ur: NEGATIVE mg/dL

## 2010-12-11 LAB — BASIC METABOLIC PANEL
Calcium: 9.7 mg/dL (ref 8.4–10.5)
GFR calc Af Amer: >60 mL/min (ref >60)
GFR calc non Af Amer: >60 mL/min (ref >60)
Glucose, Bld: 93 mg/dL (ref 70–99)
Sodium: 137 mEq/L (ref 135–145)

## 2010-12-11 LAB — DIFFERENTIAL
Basophils Absolute: 0 10*3/uL (ref 0.0–0.1)
Lymphocytes Relative: 36 % (ref 12–46)
Lymphs Abs: 1.9 10*3/uL (ref 0.7–4.0)
Monocytes Absolute: 0.4 10*3/uL (ref 0.1–1.0)
Neutro Abs: 2.9 10*3/uL (ref 1.7–7.7)

## 2010-12-11 LAB — CBC
Hemoglobin: 15.9 g/dL (ref 13.0–17.0)
RBC: 5.09 MIL/uL (ref 4.22–5.81)
RDW: 12.2 % (ref 11.5–15.5)
WBC: 5.3 10*3/uL (ref 4.0–10.5)

## 2010-12-11 LAB — RAPID URINE DRUG SCREEN, HOSP PERFORMED
Amphetamines: NOT DETECTED
Benzodiazepines: NOT DETECTED
Tetrahydrocannabinol: NOT DETECTED

## 2011-03-29 ENCOUNTER — Ambulatory Visit (INDEPENDENT_AMBULATORY_CARE_PROVIDER_SITE_OTHER): Payer: BC Managed Care – PPO | Admitting: Psychology

## 2011-03-29 DIAGNOSIS — F331 Major depressive disorder, recurrent, moderate: Secondary | ICD-10-CM

## 2011-04-14 ENCOUNTER — Ambulatory Visit: Payer: BC Managed Care – PPO | Admitting: Psychology

## 2011-04-27 ENCOUNTER — Ambulatory Visit (INDEPENDENT_AMBULATORY_CARE_PROVIDER_SITE_OTHER): Payer: BC Managed Care – PPO | Admitting: Psychology

## 2011-04-27 DIAGNOSIS — F331 Major depressive disorder, recurrent, moderate: Secondary | ICD-10-CM

## 2011-05-05 ENCOUNTER — Ambulatory Visit (INDEPENDENT_AMBULATORY_CARE_PROVIDER_SITE_OTHER): Payer: BC Managed Care – PPO | Admitting: Psychology

## 2011-05-05 DIAGNOSIS — F331 Major depressive disorder, recurrent, moderate: Secondary | ICD-10-CM

## 2011-05-16 ENCOUNTER — Ambulatory Visit: Payer: BC Managed Care – PPO | Admitting: Psychology

## 2011-06-30 ENCOUNTER — Ambulatory Visit (INDEPENDENT_AMBULATORY_CARE_PROVIDER_SITE_OTHER): Payer: BC Managed Care – PPO | Admitting: Psychology

## 2011-06-30 DIAGNOSIS — F331 Major depressive disorder, recurrent, moderate: Secondary | ICD-10-CM

## 2011-08-07 ENCOUNTER — Ambulatory Visit (INDEPENDENT_AMBULATORY_CARE_PROVIDER_SITE_OTHER): Payer: BC Managed Care – PPO | Admitting: Psychology

## 2011-08-07 DIAGNOSIS — F331 Major depressive disorder, recurrent, moderate: Secondary | ICD-10-CM

## 2011-08-08 ENCOUNTER — Emergency Department (HOSPITAL_COMMUNITY)
Admission: EM | Admit: 2011-08-08 | Discharge: 2011-08-09 | Disposition: A | Discharge status: Home / Self Care | Payer: BC Managed Care – PPO | Attending: Emergency Medicine | Admitting: Emergency Medicine

## 2011-08-08 DIAGNOSIS — R443 Hallucinations, unspecified: Secondary | ICD-10-CM | POA: Insufficient documentation

## 2011-08-08 DIAGNOSIS — F341 Dysthymic disorder: Secondary | ICD-10-CM | POA: Insufficient documentation

## 2011-08-08 DIAGNOSIS — Z765 Malingerer [conscious simulation]: Secondary | ICD-10-CM

## 2011-08-08 DIAGNOSIS — R440 Auditory hallucinations: Secondary | ICD-10-CM

## 2011-08-08 HISTORY — DX: Conversion disorder with seizures or convulsions: F44.5

## 2011-08-08 HISTORY — DX: Anxiety disorder, unspecified: F41.9

## 2011-08-08 HISTORY — DX: Major depressive disorder, single episode, unspecified: F32.9

## 2011-08-08 HISTORY — DX: Depression, unspecified: F32.A

## 2011-08-09 ENCOUNTER — Other Ambulatory Visit: Payer: Self-pay

## 2011-08-09 ENCOUNTER — Encounter: Payer: Self-pay | Admitting: *Deleted

## 2011-08-09 LAB — DIFFERENTIAL
Basophils Absolute: 0 10*3/uL (ref 0.0–0.1)
Basophils Relative: 0 % (ref 0–1)
Eosinophils Absolute: 0 10*3/uL (ref 0.0–0.7)
Eosinophils Relative: 1 % (ref 0–5)
Lymphocytes Relative: 46 % (ref 12–46)
Lymphs Abs: 2.9 10*3/uL (ref 0.7–4.0)
Monocytes Absolute: 0.4 10*3/uL (ref 0.1–1.0)
Monocytes Relative: 7 % (ref 3–12)
Neutro Abs: 2.9 10*3/uL (ref 1.7–7.7)
Neutrophils Relative %: 46 % (ref 43–77)

## 2011-08-09 LAB — COMPREHENSIVE METABOLIC PANEL
ALT: 33 U/L (ref 0–53)
AST: 20 U/L (ref 0–37)
Albumin: 4.5 g/dL (ref 3.5–5.2)
Alkaline Phosphatase: 170 U/L — ABNORMAL HIGH (ref 39–117)
BUN: 12 mg/dL (ref 6–23)
CO2: 23 mEq/L (ref 19–32)
Calcium: 10.6 mg/dL — ABNORMAL HIGH (ref 8.4–10.5)
Chloride: 102 mEq/L (ref 96–112)
Creatinine, Ser: 0.82 mg/dL (ref 0.50–1.35)
GFR calc Af Amer: >90 mL/min (ref >90)
GFR calc non Af Amer: >90 mL/min (ref >90)
Glucose, Bld: 97 mg/dL (ref 70–99)
Potassium: 3.9 mEq/L (ref 3.5–5.1)
Sodium: 136 mEq/L (ref 135–145)
Total Bilirubin: 0.4 mg/dL (ref 0.3–1.2)
Total Protein: 7.9 g/dL (ref 6.0–8.3)

## 2011-08-09 LAB — RAPID URINE DRUG SCREEN, HOSP PERFORMED
Amphetamines: POSITIVE — AB
Barbiturates: POSITIVE — AB
Benzodiazepines: NOT DETECTED
Cocaine: NOT DETECTED
Opiates: NOT DETECTED
Tetrahydrocannabinol: NOT DETECTED

## 2011-08-09 LAB — URINE MICROSCOPIC-ADD ON

## 2011-08-09 LAB — CBC
HCT: 47.6 % (ref 39.0–52.0)
Hemoglobin: 17.2 g/dL — ABNORMAL HIGH (ref 13.0–17.0)
MCH: 30.3 pg (ref 26.0–34.0)
MCHC: 36.1 g/dL — ABNORMAL HIGH (ref 30.0–36.0)
MCV: 83.8 fL (ref 78.0–100.0)
Platelets: 310 10*3/uL (ref 150–400)
RBC: 5.68 MIL/uL (ref 4.22–5.81)
RDW: 12.8 % (ref 11.5–15.5)
WBC: 6.3 10*3/uL (ref 4.0–10.5)

## 2011-08-09 LAB — URINALYSIS, ROUTINE W REFLEX MICROSCOPIC
Glucose, UA: NEGATIVE mg/dL
Hgb urine dipstick: NEGATIVE
Leukocytes, UA: NEGATIVE
Nitrite: NEGATIVE
Protein, ur: 30 mg/dL — AB
Specific Gravity, Urine: 1.033 — ABNORMAL HIGH (ref 1.005–1.030)
Urobilinogen, UA: 1 mg/dL (ref 0.0–1.0)
pH: 6 (ref 5.0–8.0)

## 2011-08-09 MED ORDER — ACETAMINOPHEN 325 MG PO TABS: 650.0000 mg | ORAL_TABLET | ORAL | Status: DC | PRN

## 2011-08-09 MED ORDER — ONDANSETRON HCL 4 MG PO TABS: 4.0000 mg | ORAL_TABLET | Freq: Three times a day (TID) | ORAL | Status: DC | PRN

## 2011-08-09 MED ORDER — IBUPROFEN 800 MG PO TABS
800.0000 mg | ORAL_TABLET | Freq: Once | ORAL | Status: AC
  Administered 2011-08-09: 800 mg via ORAL
  Filled 2011-08-09: qty 1

## 2011-08-09 MED ORDER — IBUPROFEN 600 MG PO TABS: 600.0000 mg | ORAL_TABLET | Freq: Three times a day (TID) | ORAL | Status: DC | PRN

## 2011-08-09 MED ORDER — ALUM & MAG HYDROXIDE-SIMETH 200-200-20 MG/5ML PO SUSP: 30.0000 mL | ORAL | Status: DC | PRN

## 2011-08-09 MED ORDER — LORAZEPAM 1 MG PO TABS
1.0000 mg | ORAL_TABLET | Freq: Three times a day (TID) | ORAL | Status: DC | PRN
  Administered 2011-08-09: 1 mg via ORAL
  Filled 2011-08-09: qty 1

## 2011-08-09 NOTE — ED Notes (Signed)
Did not eat breakfast, states he normally does not eat breakfast but is not hungry at this time

## 2011-08-09 NOTE — ED Notes (Signed)
Pt has been having auditory hallucinations for the past two days. Pt reports he doesn't know exactly what the voices are saying, but he hears the chatter. Pt takes seroquel, though hasn't been diagnosed bipolar. Pt does state that he has had episodes of auditory hallucinations for the past year. Pt does see a counselor for his depression and hallucinations. Pt denies SI/HI. Pt complaining of chest pressure/pain. Pt reports having chest pain in past and states diagnosis for chest pain was anxiety attacks. Mom at the bedside.

## 2011-08-09 NOTE — ED Provider Notes (Signed)
History     CSN: [id number] Arrival date & time: 08/08/2011 11:19 PM   First MD Initiated Contact with Patient 08/09/11 0132      Chief Complaint  Patient presents with  . Hallucinations     HPI: Reports onset of auditory hallucinations since approx 1700-1800 this evening. States he has had these hallucinations on and off x 1 year but today worse and feeling very anxious. States he can not hear what the voices are saying he just "hears the chatter'. States has had chest pain since last week that has been constant. Has had this same CP in the past and was told it was anxiety. Denies other physical c/o's. Admitd to previous admissions to [facility] for visual and auditory hallucinations. Denies SI/HI.   Past Medical History  Diagnosis Date  . Anxiety   . Depression   . Pseudoseizure     Past Surgical History  Procedure Date  . Shoulder surgery     Family History  Problem Relation Age of Onset  . Hypertension Mother   . Migraines Mother     History  Substance Use Topics  . Smoking status: Never Smoker   . Smokeless tobacco: Never Used  . Alcohol Use: No      Review of Systems  Constitutional: Negative.   HENT: Negative.   Eyes: Negative.   Respiratory: Negative.   Cardiovascular: Negative.   Gastrointestinal: Negative.   Genitourinary: Negative.   Musculoskeletal: Negative.   Skin: Negative.   Neurological: Negative.   Hematological: Negative.   Psychiatric/Behavioral: Negative.     Allergies  Review of patient's allergies indicates no known allergies.  Home Medications   Current Outpatient Rx  Name Route Sig Dispense Refill  . AMPHETAMINE-DEXTROAMPHETAMINE 10 MG PO TABS Oral Take 10 mg by mouth daily.      . AMPHETAMINE-DEXTROAMPHETAMINE 30 MG PO TABS Oral Take 30 mg by mouth daily.      . QUETIAPINE FUMARATE 50 MG PO TABS Oral Take 50 mg by mouth at bedtime.      Marland Kitchen CLONAZEPAM 0.5 MG PO TABS Oral Take 1 tablet (0.5 mg total) by mouth 2 (two) times daily. 60  tablet 3    BP 130/90  Pulse 122  Temp(Src) 99.2 F (37.3 C) (Oral)  Resp 16  Ht 5\' 6"  (1.676 m)  Wt 170 lb (77.111 kg)  BMI 27.44 kg/m2  SpO2 100%  Physical Exam  Constitutional: He is oriented to person, place, and time. He appears well-developed and well-nourished.  HENT:  Head: Normocephalic and atraumatic.  Eyes: Pupils are equal, round, and reactive to light.  Neck: Neck supple.  Cardiovascular: Normal rate and regular rhythm.   Pulmonary/Chest: Effort normal and breath sounds normal.  Abdominal: Soft. Bowel sounds are normal.  Neurological: He is alert and oriented to person, place, and time. He has normal strength. No cranial nerve deficit. GCS eye subscore is 4. GCS verbal subscore is 5. GCS motor subscore is 6.  Skin: Skin is warm and dry.  Psychiatric: His speech is normal and behavior is normal. His affect is blunt. Cognition and memory are normal. He expresses no suicidal plans and no homicidal plans.    ED Course  Procedures Pt requesting evaluation for auditory hallucinations. States recently taken off of Klonopin and placed on Seroquel for sleep.  Will medically clear, then consult w/ Act Team regarding pt eval.  Date: 08/09/2011  Rate: 116  Rhythm: sinus tachycardia  QRS Axis: normal  Intervals: normal  ST/T Wave  abnormalities: normal  Conduction Disutrbances:none  Narrative Interpretation:   Old EKG Reviewed: changes noted    I have spoken w/ Act Team who has agreed to evaluate pt for likely inpt placement. Will move to psych ED for holding  Labs Reviewed - No data to display No results found.   No diagnosis found.    MDM  Auditory hallucinations (recent medication change)        Leanne Chang, NP 08/09/11 0308  Roma Kayser Amarise Lillo, NP 08/09/11 0330

## 2011-08-09 NOTE — ED Notes (Signed)
Seen by Dr Rennis Chris and discharged

## 2011-08-09 NOTE — ED Provider Notes (Addendum)
1005 ampt alert, gcs 15 appropriate, no distress denies si or hi .  Pulse 92. Pt assessses by [facility]. sgessts pt malgering, d/c seroquel and adderal outpt psychiatric f/u  Doug Sou, MD 08/09/11 1109  Doug Sou, MD 08/09/11 1737

## 2011-08-09 NOTE — ED Notes (Signed)
TC with Dr. Berneice Heinrich [facility] consult) who stated he believes patient is malingering. Does not feel that pt is truly having psychotic episode and is "faking" it. Pt discussed attempting to get on disability with MD and informed him of the seizures he has, but pt believes these to be real. Recommending d/c pt home. Will await full report from MD.

## 2011-08-09 NOTE — ED Provider Notes (Signed)
Medical screening examination/treatment/procedure(s) were performed by non-physician practitioner and as supervising physician I was immediately available for consultation/collaboration.  Duane Earnshaw M Takoya Jonas, MD 08/09/11 0741 

## 2011-08-09 NOTE — ED Notes (Signed)
Pt reports that he has been to [facility] in the past reports that it did not help him and is requesting that he not go there pt reports that he doesn't understand why he is here pt advised that he is awaiting act team to see him and that he was next on her list to see pt resitng in the bed watching tv appears very anxious no eye contact pt withdrawn from conversation pt reports that he is not a "people person"pt denies SI/HI and visual hallucinations
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
## 2011-08-22 ENCOUNTER — Ambulatory Visit: Payer: BC Managed Care – PPO | Admitting: Psychology

## 2011-08-24 ENCOUNTER — Ambulatory Visit (INDEPENDENT_AMBULATORY_CARE_PROVIDER_SITE_OTHER): Payer: BC Managed Care – PPO | Admitting: Psychology

## 2011-08-24 DIAGNOSIS — F331 Major depressive disorder, recurrent, moderate: Secondary | ICD-10-CM

## 2011-09-01 ENCOUNTER — Ambulatory Visit (INDEPENDENT_AMBULATORY_CARE_PROVIDER_SITE_OTHER): Payer: BC Managed Care – PPO | Admitting: Psychology

## 2011-09-01 DIAGNOSIS — F331 Major depressive disorder, recurrent, moderate: Secondary | ICD-10-CM

## 2011-09-13 ENCOUNTER — Emergency Department (HOSPITAL_COMMUNITY): Payer: BC Managed Care – PPO

## 2011-09-13 ENCOUNTER — Emergency Department (HOSPITAL_COMMUNITY)
Admission: EM | Admit: 2011-09-13 | Discharge: 2011-09-13 | Disposition: A | Discharge status: Home / Self Care | Payer: BC Managed Care – PPO | Attending: Emergency Medicine | Admitting: Emergency Medicine

## 2011-09-13 ENCOUNTER — Encounter (HOSPITAL_COMMUNITY): Payer: Self-pay

## 2011-09-13 DIAGNOSIS — W19XXXA Unspecified fall, initial encounter: Secondary | ICD-10-CM | POA: Insufficient documentation

## 2011-09-13 DIAGNOSIS — F988 Other specified behavioral and emotional disorders with onset usually occurring in childhood and adolescence: Secondary | ICD-10-CM | POA: Insufficient documentation

## 2011-09-13 DIAGNOSIS — F411 Generalized anxiety disorder: Secondary | ICD-10-CM | POA: Insufficient documentation

## 2011-09-13 DIAGNOSIS — Y92838 Other recreation area as the place of occurrence of the external cause: Secondary | ICD-10-CM | POA: Insufficient documentation

## 2011-09-13 DIAGNOSIS — F329 Major depressive disorder, single episode, unspecified: Secondary | ICD-10-CM | POA: Insufficient documentation

## 2011-09-13 DIAGNOSIS — F3289 Other specified depressive episodes: Secondary | ICD-10-CM | POA: Insufficient documentation

## 2011-09-13 DIAGNOSIS — K589 Irritable bowel syndrome without diarrhea: Secondary | ICD-10-CM | POA: Insufficient documentation

## 2011-09-13 DIAGNOSIS — S43016A Anterior dislocation of unspecified humerus, initial encounter: Secondary | ICD-10-CM | POA: Insufficient documentation

## 2011-09-13 DIAGNOSIS — S43006A Unspecified dislocation of unspecified shoulder joint, initial encounter: Secondary | ICD-10-CM

## 2011-09-13 DIAGNOSIS — Y9239 Other specified sports and athletic area as the place of occurrence of the external cause: Secondary | ICD-10-CM | POA: Insufficient documentation

## 2011-09-13 HISTORY — DX: Irritable bowel syndrome, unspecified: K58.9

## 2011-09-13 HISTORY — DX: Other specified behavioral and emotional disorders with onset usually occurring in childhood and adolescence: F98.8

## 2011-09-13 MED ORDER — HYDROCODONE-ACETAMINOPHEN 5-325 MG PO TABS: 1.0000 | ORAL_TABLET | Freq: Four times a day (QID) | ORAL | Status: AC | PRN

## 2011-09-13 MED ORDER — ETOMIDATE 2 MG/ML IV SOLN
12.0000 mg | Freq: Once | INTRAVENOUS | Status: AC
  Administered 2011-09-13: 20 mg via INTRAVENOUS
  Filled 2011-09-13: qty 10

## 2011-09-13 MED ORDER — LORAZEPAM 2 MG/ML IJ SOLN
2.0000 mg | Freq: Once | INTRAMUSCULAR | Status: AC
  Administered 2011-09-13: 2 mg via INTRAVENOUS
  Filled 2011-09-13: qty 1

## 2011-09-13 MED ORDER — HYDROMORPHONE HCL PF 1 MG/ML IJ SOLN
1.0000 mg | Freq: Once | INTRAMUSCULAR | Status: AC
  Administered 2011-09-13: 1 mg via INTRAVENOUS
  Filled 2011-09-13: qty 1

## 2011-09-13 MED ORDER — ETOMIDATE 2 MG/ML IV SOLN
INTRAVENOUS | Status: AC | PRN
  Administered 2011-09-13: 12 mg via INTRAVENOUS

## 2011-09-13 NOTE — ED Provider Notes (Addendum)
History   This chart was scribed for [name], MD, MD by Smitty Pluck. The patient was seen in room APA01 and the patient's care was started at 8:18PM.    CSN: [id number]  Arrival date & time 09/13/11  1954   First MD Initiated Contact with Patient 09/13/11 2008      Chief Complaint  Patient presents with  . Shoulder Pain    (Consider location/radiation/quality/duration/timing/severity/associated sxs/prior treatment) Patient is a 21 y.o. male presenting with shoulder pain. The history is provided by the patient and a parent.  Shoulder Pain   Brandon Roberts is a 21 y.o. male who presents to the Emergency Department complaining of moderate left shoulder pain onset today after fall while playing basketball. Pt reports that he has had surgery on both shoulders in the past. Pain has been constant since onset. There is no radiation of pain. Pt denies nausea, and vomiting.  Past Medical History  Diagnosis Date  . Anxiety   . Depression   . Pseudoseizure   . Irritable bowel syndrome (IBS)   . ADD (attention deficit disorder)     Past Surgical History  Procedure Date  . Shoulder surgery     Family History  Problem Relation Age of Onset  . Hypertension Mother   . Migraines Mother     History  Substance Use Topics  . Smoking status: Never Smoker   . Smokeless tobacco: Never Used  . Alcohol Use: No      Review of Systems  All other systems reviewed and are negative.   10 Systems reviewed and are negative for acute change except as noted in the HPI.  Allergies  Review of patient's allergies indicates no known allergies.  Home Medications   Current Outpatient Rx  Name Route Sig Dispense Refill  . AMPHETAMINE-DEXTROAMPHET ER 15 MG PO CP24 Oral Take 15 mg by mouth 2 (two) times daily.    Marland Kitchen FLUOXETINE HCL 10 MG PO CAPS Oral Take 10 mg by mouth daily.    Marland Kitchen HYOSCYAMINE SULFATE 0.125 MG PO TABS Oral Take 0.125 mg by mouth 2 (two) times daily. Stomach pain     .  LORAZEPAM 0.5 MG PO TABS Oral Take 0.5 mg by mouth 4 (four) times daily as needed. For anxiety    . QUETIAPINE FUMARATE 100 MG PO TABS Oral Take 100 mg by mouth at bedtime.    Marland Kitchen HYDROCODONE-ACETAMINOPHEN 5-325 MG PO TABS Oral Take 1 tablet by mouth every 6 (six) hours as needed for pain. 15 tablet 0    BP 112/60  Pulse 63  Temp(Src) 98 F (36.7 C) (Oral)  Resp 19  Ht 5\' 6"  (1.676 m)  Wt 172 lb (78.019 kg)  BMI 27.76 kg/m2  SpO2 98%  Physical Exam  Nursing note and vitals reviewed. Constitutional: He is oriented to person, place, and time. He appears well-developed and well-nourished.  HENT:  Head: Normocephalic and atraumatic.  Eyes: Conjunctivae are normal. Pupils are equal, round, and reactive to light.  Neck: Neck supple. No tracheal deviation present.  Musculoskeletal: Normal range of motion. He exhibits tenderness (left shoulder).       Inability to move left arm  Neurological: He is alert and oriented to person, place, and time.  Skin: Skin is warm and dry.  Psychiatric: He has a normal mood and affect. His behavior is normal.    ED Course  ORTHOPEDIC INJURY TREATMENT Performed by: Soriya Worster L Authorized by: Justiss Gerbino L  ORTHOPEDIC INJURY  TREATMENT Performed by: Yvon Mccord L Authorized by: Bethann Berkshire L Comments: Pt was given etomidate and then his right shoulder was reduced with traction.  Pt tolerated the procedure well.  pt had his right shoulder reduced without complications.  Neuro vasc exam normal after reduction.  Pt given etomidate for concious sedation without complication  DIAGNOSTIC STUDIES: Oxygen Saturation is 97% on room air, normal by my interpretation.    COORDINATION OF CARE:  9:02PM Recheck: EDP discusses lab results with pt. Pt is still in pain.   Labs Reviewed - No data to display Dg Shoulder Left  09/13/2011  *RADIOLOGY REPORT*  Clinical Data: Left shoulder injury after fall.  LEFT SHOULDER - 2+ VIEW  Comparison: 09/24/2007   Findings: Three views study shows anterior humeral head dislocation with probable subtle Hill-Sachs deformity associated.  IMPRESSION: Anterior left humeral head dislocation.  Original Report Authenticated By: ERIC A. MANSELL, M.D.   Dg Shoulder Left Port  09/13/2011  *RADIOLOGY REPORT*  Clinical Data: Post shoulder reduction  PORTABLE LEFT SHOULDER - 2+ VIEW  Comparison: Plain film 09/13/2011 at 2042 hours  Findings: Interval reduction of the anterior dislocation. There is flattening along the lateral aspect of the humeral head which may represent Hill-Sachs deformity.  IMPRESSION: Successful reduction of anterior shoulder dislocation.  Probable Hill-Sachs deformity.  Original Report Authenticated By: Genevive Bi, M.D.     1. Shoulder dislocation       MDM        The chart was scribed for me under my direct supervision.  I personally performed the history, physical, and medical decision making and all procedures in the evaluation of this patient.[name], MD 09/13/11 2300  [name], MD 10/10/11 [phone]

## 2011-09-13 NOTE — ED Notes (Signed)
[place]; injury to left shoulder; possible dislocation.

## 2011-10-12 ENCOUNTER — Ambulatory Visit: Payer: BC Managed Care – PPO | Admitting: Psychology

## 2011-10-17 ENCOUNTER — Ambulatory Visit: Payer: BC Managed Care – PPO | Admitting: Psychology

## 2011-10-31 ENCOUNTER — Ambulatory Visit (INDEPENDENT_AMBULATORY_CARE_PROVIDER_SITE_OTHER): Payer: BC Managed Care – PPO | Admitting: Psychology

## 2011-10-31 DIAGNOSIS — F331 Major depressive disorder, recurrent, moderate: Secondary | ICD-10-CM

## 2011-11-09 ENCOUNTER — Ambulatory Visit (INDEPENDENT_AMBULATORY_CARE_PROVIDER_SITE_OTHER): Payer: BC Managed Care – PPO | Admitting: Psychology

## 2011-11-09 DIAGNOSIS — F331 Major depressive disorder, recurrent, moderate: Secondary | ICD-10-CM

## 2011-11-17 ENCOUNTER — Ambulatory Visit (INDEPENDENT_AMBULATORY_CARE_PROVIDER_SITE_OTHER): Payer: BC Managed Care – PPO | Admitting: Psychology

## 2011-11-17 DIAGNOSIS — F331 Major depressive disorder, recurrent, moderate: Secondary | ICD-10-CM

## 2011-11-28 ENCOUNTER — Ambulatory Visit (INDEPENDENT_AMBULATORY_CARE_PROVIDER_SITE_OTHER): Payer: BC Managed Care – PPO | Admitting: Psychology

## 2011-11-28 DIAGNOSIS — F331 Major depressive disorder, recurrent, moderate: Secondary | ICD-10-CM

## 2011-12-07 ENCOUNTER — Ambulatory Visit: Payer: BC Managed Care – PPO | Admitting: Psychology

## 2011-12-08 ENCOUNTER — Ambulatory Visit (HOSPITAL_COMMUNITY)
Admission: RE | Admit: 2011-12-08 | Discharge: 2011-12-08 | Disposition: A | Discharge status: Home / Self Care | Payer: BC Managed Care – PPO | Attending: Psychiatry | Admitting: Psychiatry

## 2011-12-08 DIAGNOSIS — F988 Other specified behavioral and emotional disorders with onset usually occurring in childhood and adolescence: Secondary | ICD-10-CM | POA: Insufficient documentation

## 2011-12-08 DIAGNOSIS — F39 Unspecified mood [affective] disorder: Secondary | ICD-10-CM | POA: Insufficient documentation

## 2011-12-08 DIAGNOSIS — K589 Irritable bowel syndrome without diarrhea: Secondary | ICD-10-CM | POA: Insufficient documentation
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
## 2011-12-11 ENCOUNTER — Ambulatory Visit: Payer: BC Managed Care – PPO | Admitting: Psychology

## 2011-12-15 ENCOUNTER — Ambulatory Visit (INDEPENDENT_AMBULATORY_CARE_PROVIDER_SITE_OTHER): Payer: BC Managed Care – PPO | Admitting: Psychology

## 2011-12-15 DIAGNOSIS — F331 Major depressive disorder, recurrent, moderate: Secondary | ICD-10-CM

## 2011-12-25 ENCOUNTER — Ambulatory Visit (INDEPENDENT_AMBULATORY_CARE_PROVIDER_SITE_OTHER): Payer: BC Managed Care – PPO | Admitting: Psychology

## 2011-12-25 DIAGNOSIS — F331 Major depressive disorder, recurrent, moderate: Secondary | ICD-10-CM

## 2012-01-16 ENCOUNTER — Encounter (HOSPITAL_COMMUNITY): Payer: Self-pay | Admitting: *Deleted

## 2012-01-16 ENCOUNTER — Inpatient Hospital Stay (HOSPITAL_COMMUNITY)
Admission: RE | Admit: 2012-01-16 | Discharge: 2012-01-18 | DRG: 430 | Disposition: A | Discharge status: Home / Self Care | Payer: BC Managed Care – PPO | Attending: Psychiatry | Admitting: Psychiatry

## 2012-01-16 DIAGNOSIS — Z79899 Other long term (current) drug therapy: Secondary | ICD-10-CM

## 2012-01-16 DIAGNOSIS — F319 Bipolar disorder, unspecified: Secondary | ICD-10-CM | POA: Diagnosis present

## 2012-01-16 DIAGNOSIS — F411 Generalized anxiety disorder: Secondary | ICD-10-CM | POA: Diagnosis present

## 2012-01-16 DIAGNOSIS — F3130 Bipolar disorder, current episode depressed, mild or moderate severity, unspecified: Principal | ICD-10-CM | POA: Diagnosis present

## 2012-01-16 DIAGNOSIS — K589 Irritable bowel syndrome without diarrhea: Secondary | ICD-10-CM | POA: Diagnosis present

## 2012-01-16 DIAGNOSIS — F988 Other specified behavioral and emotional disorders with onset usually occurring in childhood and adolescence: Secondary | ICD-10-CM | POA: Diagnosis present

## 2012-01-16 DIAGNOSIS — G4700 Insomnia, unspecified: Secondary | ICD-10-CM | POA: Diagnosis present

## 2012-01-16 DIAGNOSIS — R45850 Homicidal ideations: Secondary | ICD-10-CM

## 2012-01-16 MED ORDER — ALUM & MAG HYDROXIDE-SIMETH 200-200-20 MG/5ML PO SUSP: 30.0000 mL | ORAL | Status: DC | PRN

## 2012-01-16 MED ORDER — ACETAMINOPHEN 325 MG PO TABS
650.0000 mg | ORAL_TABLET | Freq: Four times a day (QID) | ORAL | Status: DC | PRN
  Administered 2012-01-17 – 2012-01-18 (×2): 650 mg via ORAL

## 2012-01-16 MED ORDER — MAGNESIUM HYDROXIDE 400 MG/5ML PO SUSP: 30.0000 mL | Freq: Every day | ORAL | Status: DC | PRN

## 2012-01-16 MED ORDER — HYDROXYZINE HCL 50 MG PO TABS
50.0000 mg | ORAL_TABLET | Freq: Every evening | ORAL | Status: DC | PRN
  Administered 2012-01-17: 50 mg via ORAL
  Filled 2012-01-16: qty 1
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
## 2012-01-17 DIAGNOSIS — G4700 Insomnia, unspecified: Secondary | ICD-10-CM

## 2012-01-17 DIAGNOSIS — F411 Generalized anxiety disorder: Secondary | ICD-10-CM

## 2012-01-17 DIAGNOSIS — F319 Bipolar disorder, unspecified: Secondary | ICD-10-CM | POA: Diagnosis present

## 2012-01-17 MED ORDER — FLUOXETINE HCL 20 MG PO CAPS: 20.0000 mg | ORAL_CAPSULE | Freq: Every day | ORAL | Status: DC

## 2012-01-17 MED ORDER — DIPHENHYDRAMINE HCL 25 MG PO CAPS
25.0000 mg | ORAL_CAPSULE | ORAL | Status: DC | PRN
Start: 2012-01-17 — End: 2012-01-18
  Administered 2012-01-17: 25 mg via ORAL

## 2012-01-17 MED ORDER — HYOSCYAMINE SULFATE 0.125 MG PO TBDP
0.1250 mg | ORAL_TABLET | Freq: Two times a day (BID) | ORAL | Status: DC
  Administered 2012-01-17 – 2012-01-18 (×2): 0.125 mg via ORAL
  Filled 2012-01-17 (×4): qty 1

## 2012-01-17 MED ORDER — QUETIAPINE FUMARATE 100 MG PO TABS
100.0000 mg | ORAL_TABLET | Freq: Every day | ORAL | Status: DC
  Administered 2012-01-17: 100 mg via ORAL
  Filled 2012-01-17 (×2): qty 1

## 2012-01-17 MED ORDER — LAMOTRIGINE 100 MG PO TABS: 100.0000 mg | ORAL_TABLET | Freq: Every day | ORAL | Status: DC

## 2012-01-17 MED ORDER — QUETIAPINE FUMARATE 25 MG PO TABS
25.0000 mg | ORAL_TABLET | Freq: Three times a day (TID) | ORAL | Status: DC
  Administered 2012-01-17 – 2012-01-18 (×4): 25 mg via ORAL
  Filled 2012-01-17 (×6): qty 1

## 2012-01-17 MED ORDER — FLUOXETINE HCL 20 MG PO CAPS
20.0000 mg | ORAL_CAPSULE | Freq: Every day | ORAL | Status: DC
  Administered 2012-01-17: 20 mg via ORAL
  Filled 2012-01-17 (×2): qty 1

## 2012-01-17 MED ORDER — LAMOTRIGINE 25 MG PO TABS
50.0000 mg | ORAL_TABLET | Freq: Every day | ORAL | Status: DC
  Administered 2012-01-17 – 2012-01-18 (×2): 50 mg via ORAL
  Filled 2012-01-17 (×4): qty 2

## 2012-01-17 NOTE — Progress Notes (Signed)
[facility] Group Notes: (Counselor/Nursing/MHT/Case Management/Adjunct) 01/17/2012    11:00am Emotion Regulation  Type of Therapy:  Group Therapy  Participation Level:  Limited  Participation Quality:  Attentive, Appropriate  Affect:  Appropriate  Cognitive:  Appropriate  Insight:  Limited  Engagement in Group: Limited  Engagement in Therapy:  Limited  Modes of Intervention:  Support and Exploration  Summary of Progress/Problems: Brandon Roberts came to group late, but was very engaged. He did not share about his personal experiences with anger or anxiety, but did participated with relaxation techniques, and shared that they helped him to calm his anxiety. Brandon Roberts seemed surprised that they were helpful, but was very pleasant.  [name] 01/17/2012  2:02 PM

## 2012-01-17 NOTE — Progress Notes (Addendum)
[facility] MD Progress Note  01/17/2012 5:28 PM  Diagnosis:  Axis I: Anxiety Disorder NOS, Bipolar, Depressed and Insomnia  ADL's:  Intact  Sleep: Poor  Appetite:  Fair  Suicidal Ideation:  Pt denies any suicidal thoughts Homicidal Ideation:  Denies any current homicidal thoughts.  Mental Status Examination/Evaluation: Objective:  Appearance: Casual  Eye Contact::  Good  Speech:  Clear and Coherent  Volume:  Normal  Mood:  Anxious and Dysphoric  Affect:  Congruent  Thought Process:  Coherent  Orientation:  Full  Thought Content:  WDL  Suicidal Thoughts:  No  Homicidal Thoughts:  No  Memory:  Immediate;   Fair  Judgement:  Impaired  Insight:  Good  Psychomotor Activity:  Normal  Concentration:  Fair  Recall:  Fair  Akathisia:  No  AIMS (if indicated):     Assets:  Communication Skills Desire for Improvement  Sleep:  Number of Hours: 5.5    Vital Signs:Blood pressure 121/74, pulse 110, temperature 97.8 F (36.6 C), temperature source Oral, resp. rate 16, height 5\' 6"  (1.676 m), weight 83.008 kg (183 lb). Current Medications: Current Facility-Administered Medications  Medication Dose Route Frequency Provider Last Rate Last Dose  . acetaminophen (TYLENOL) tablet 650 mg  650 mg Oral Q6H PRN Curlene Labrum Readling, MD      . alum & mag hydroxide-simeth (MAALOX/MYLANTA) 200-200-20 MG/5ML suspension 30 mL  30 mL Oral Q4H PRN Curlene Labrum Readling, MD      . diphenhydrAMINE (BENADRYL) capsule 25 mg  25 mg Oral Q4H PRN Mike Craze, MD      . FLUoxetine (PROZAC) capsule 20 mg  20 mg Oral Daily Mike Craze, MD   20 mg at 01/17/12 1650  . hydrOXYzine (ATARAX/VISTARIL) tablet 50 mg  50 mg Oral QHS PRN Curlene Labrum Readling, MD   50 mg at 01/17/12 0005  . hyoscyamine (ANASPAZ) tablet 0.125 mg  0.125 mg Oral BID Mike Craze, MD   0.125 mg at 01/17/12 1653  . lamoTRIgine (LAMICTAL) tablet 50 mg  50 mg Oral Daily Mike Craze, MD   50 mg at 01/17/12 1151  . magnesium hydroxide (MILK OF  MAGNESIA) suspension 30 mL  30 mL Oral Daily PRN Curlene Labrum Readling, MD      . QUEtiapine (SEROQUEL) tablet 100 mg  100 mg Oral QHS Mike Craze, MD      . QUEtiapine (SEROQUEL) tablet 25 mg  25 mg Oral TID Mike Craze, MD   25 mg at 01/17/12 1650  . DISCONTD: FLUoxetine (PROZAC) capsule 20 mg  20 mg Oral Daily Mike Craze, MD      . DISCONTD: lamoTRIgine (LAMICTAL) tablet 100 mg  100 mg Oral Daily Mike Craze, MD        Lab Results: No results found for this or any previous visit (from the past 48 hour(s)).  Physical Findings: AIMS:  , ,  ,  ,    CIWA:    COWS:     Treatment Plan Summary: Daily contact with patient to assess and evaluate symptoms and progress in treatment Medication management Restart non narcotic medications  Plan: Admit, restart his non narcotic medications and none of the medications that disinhibit him.  Help him see the need to stay on them and continue to apply for disability.  Kc Sedlak 01/17/2012, 5:28 PM
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
## 2012-01-17 NOTE — Tx Team (Signed)
Interdisciplinary Treatment Plan Update (Adult)  Date:  01/17/2012  Time Reviewed:  10:27 AM   Progress in Treatment: Attending groups: Yes Participating in groups:  Yes Taking medication as prescribed: Yes Tolerating medication:  Yes Family/Significant other contact made:  Counselor assessing for appropriate contact Patient understands diagnosis:  Yes Discussing patient identified problems/goals with staff:  Yes Medical problems stabilized or resolved:  Yes Denies suicidal/homicidal ideation: Yes Issues/concerns per patient self-inventory:  None identified Other: N/A  New problem(s) identified: None Identified  Reason for Continuation of Hospitalization: Homicidal ideation Medication stabilization  Interventions implemented related to continuation of hospitalization: mood stabilization, medication monitoring and adjustment, group therapy and psycho education, safety checks q 15 mins  Additional comments: N/A  Estimated length of stay: 3-5 days  Discharge Plan: Pt will follow up with Dr. Tomasa Rand and Velta Addison, NP for therapy and medication management.   New goal(s): N/A  Review of initial/current patient goals per problem list:    1.  Goal(s): Reduce depressive symptoms  Met:  No  Target date: by discharge  As evidenced by: Reducing depression from a 10 to a 3 as reported by pt.   2.  Goal (s): Reduce/Eliminate homicidal ideation  Met:  No  Target date: by discharge  As evidenced by: pt reporting no HI.    3.  Goal(s): Reduce anxiety symptoms  Met:  No  Target date: by discharge  As evidenced by: Reduce anxiety from a 10 to a 3 as reported by pt.    Attendees: Patient:  Brandon Roberts 01/17/2012 11:00 AM  Family:     Physician:  Orson Aloe, MD  01/17/2012  10:27 AM   Nursing:   Skipper Cliche, RN 01/17/2012 10:29 AM   Case Manager:  Reyes Ivan, LCSWA 01/17/2012  10:27 AM   Counselor:  Angus Palms, LCSW 01/17/2012  10:27 AM   Other:  Juline Patch, LCSW 01/17/2012  10:27 AM   Other:   01/17/2012  10:27 AM   Other:     Other:      Scribe for Treatment Team:   Carmina Miller, 01/17/2012 , 10:27 AM
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
## 2012-01-17 NOTE — Progress Notes (Signed)
Recreation Therapy Notes  01/17/2012         Time: 1415      Group Topic/Focus: The focus of this group is on enhancing patients' problem solving skills, which involves identifying the problem, brainstorming solutions and choosing and trying a solution.   Participation Level: Active  Participation Quality: Attentive and Supportive  Affect: Appropriate  Cognitive: Oriented   Additional Comments: Patient very quiet, reserved, but took a leadership role in the activity as time went on. Patient able to problem solve effectively and explain it to his peers.   Marnesha Gagen 01/17/2012 3:14 PM

## 2012-01-17 NOTE — Progress Notes (Signed)
Patient ID: Brandon Roberts, male   DOB: 1990-11-17, 21 y.o.   MRN: [id number] Pt given Vistaril for sleep prn as ordered. Pt calm, and cooperative during assessment.

## 2012-01-17 NOTE — Progress Notes (Signed)
Patient ID: Brandon Roberts, male   DOB: 1990-10-30, 21 y.o.   MRN: [id number] Pt asleep; medication effective.

## 2012-01-17 NOTE — Progress Notes (Signed)
Pt pleasant and cooperative. Pt states that he is not depressed or hopeless and no longer feels HI towards a particular person. Pt attends groups and interacts well with peers and staff. Pt was started on some new medications. Pt was offered support and encouragement. Pt is receptive to treatment and safety is maintained on unit.

## 2012-01-18 ENCOUNTER — Ambulatory Visit: Payer: Self-pay | Admitting: Psychology

## 2012-01-18 MED ORDER — QUETIAPINE FUMARATE 25 MG PO TABS: 25.0000 mg | ORAL_TABLET | Freq: Three times a day (TID) | ORAL | Status: AC

## 2012-01-18 MED ORDER — FLUOXETINE HCL 20 MG PO TABS: ORAL_TABLET | ORAL | Status: DC

## 2012-01-18 MED ORDER — QUETIAPINE FUMARATE 100 MG PO TABS: 200.0000 mg | ORAL_TABLET | Freq: Every day | ORAL | Status: DC

## 2012-01-18 MED ORDER — LAMOTRIGINE 100 MG PO TABS: ORAL_TABLET | ORAL | Status: DC

## 2012-01-18 NOTE — Progress Notes (Signed)
[facility] MD Progress Note  01/18/2012 11:41 AM  Diagnosis:  Axis I: Anxiety Disorder NOS, Bipolar, Depressed and Insomnia  ADL's:  Intact  Sleep: Good  Appetite:  Good  Suicidal Ideation:  Pt denies any suicidal thoughts Homicidal Ideation:  Denies any current homicidal thoughts.  Mental Status Examination/Evaluation: Objective:  Appearance: Casual  Eye Contact::  Good  Speech:  Clear and Coherent  Volume:  Normal  Mood:  Euthymic  Affect:  Congruent  Thought Process:  Coherent  Orientation:  Full  Thought Content:  WDL  Suicidal Thoughts:  No  Homicidal Thoughts:  No  Memory:  Immediate;   Good  Judgement:  Good  Insight:  Good  Psychomotor Activity:  Normal  Concentration:  Good  Recall:  Good  Akathisia:  No  AIMS (if indicated):     Assets:  Communication Skills Desire for Improvement  Sleep:  Number of Hours: 6.25    Vital Signs:Blood pressure 120/76, pulse 110, temperature 100.7 F (38.2 C), temperature source Oral, resp. rate 16, height 5\' 6"  (1.676 m), weight 83.008 kg (183 lb). Current Medications: Current Facility-Administered Medications  Medication Dose Route Frequency Provider Last Rate Last Dose  . acetaminophen (TYLENOL) tablet 650 mg  650 mg Oral Q6H PRN Ronny Bacon, MD   650 mg at 01/18/12 0649  . alum & mag hydroxide-simeth (MAALOX/MYLANTA) 200-200-20 MG/5ML suspension 30 mL  30 mL Oral Q4H PRN Curlene Labrum Readling, MD      . diphenhydrAMINE (BENADRYL) capsule 25 mg  25 mg Oral Q4H PRN Mike Craze, MD   25 mg at 01/17/12 1900  . FLUoxetine (PROZAC) capsule 20 mg  20 mg Oral Daily Mike Craze, MD   20 mg at 01/17/12 1650  . hydrOXYzine (ATARAX/VISTARIL) tablet 50 mg  50 mg Oral QHS PRN Curlene Labrum Readling, MD   50 mg at 01/17/12 0005  . hyoscyamine (ANASPAZ) tablet 0.125 mg  0.125 mg Oral BID Mike Craze, MD   0.125 mg at 01/18/12 0759  . lamoTRIgine (LAMICTAL) tablet 50 mg  50 mg Oral Daily Mike Craze, MD   50 mg at 01/18/12 0758  .  magnesium hydroxide (MILK OF MAGNESIA) suspension 30 mL  30 mL Oral Daily PRN Curlene Labrum Readling, MD      . QUEtiapine (SEROQUEL) tablet 100 mg  100 mg Oral QHS Mike Craze, MD   100 mg at 01/17/12 2132  . QUEtiapine (SEROQUEL) tablet 25 mg  25 mg Oral TID Mike Craze, MD   25 mg at 01/18/12 4098    Lab Results: No results found for this or any previous visit (from the past 48 hour(s)).  Physical Findings: AIMS:  , ,  ,  ,    CIWA:    COWS:     Treatment Plan Summary: Daily contact with patient to assess and evaluate symptoms and progress in treatment Medication management Restart non narcotic medications  Discussion/Plan: Pt has noted that he is sleeping better. He has learned some new coping strategies and plans to use them when he returns home.  Evalynn Hankins 01/18/2012, 11:41 AM
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
## 2012-01-18 NOTE — Tx Team (Signed)
Interdisciplinary Treatment Plan Update (Adult)  Date:  01/18/2012  Time Reviewed:  10:44 AM   Progress in Treatment: Attending groups: Yes Participating in groups:  Yes Taking medication as prescribed: Yes Tolerating medication:  Yes Family/Significant other contact made:   Patient understands diagnosis:  Yes Discussing patient identified problems/goals with staff:  Yes Medical problems stabilized or resolved:  Yes Denies suicidal/homicidal ideation: Yes Issues/concerns per patient self-inventory:  None identified Other: N/A  New problem(s) identified: None Identified  Reason for Continuation of Hospitalization: Stable to d/c  Interventions implemented related to continuation of hospitalization: Stable to d/c  Additional comments: N/A  Estimated length of stay: D/C today  Discharge Plan: Pt will follow up with Dr. Tomasa Rand for therapy and Carolynn Sayers, NP for medication management.    New goal(s): N/A  Review of initial/current patient goals per problem list:    1.  Goal(s): Reduce depressive symptoms  Met:  Yes  Target date: by discharge  As evidenced by: Reducing depression from a 10 to a 3 as reported by pt. Pt ranks at a 1 today.   2.  Goal (s): Reduce/Eliminate homicidal ideation  Met:  Yes  Target date: by discharge  As evidenced by: pt reporting no HI.    3.  Goal(s): Reduce anxiety symptoms  Met:  Yes  Target date: by discharge  As evidenced by: Reduce anxiety from a 10 to a 3 as reported by pt. Pt ranks at a 1 today.     Attendees: Patient:  Brandon Roberts 01/18/2012 10:46 AM   Family:     Physician:  Orson Aloe, MD  01/18/2012  10:44 AM   Nursing:   Omelia Blackwater, RN 01/18/2012 10:46 AM   Case Manager:  Reyes Ivan, LCSWA 01/18/2012  10:44 AM   Counselor:  Angus Palms, LCSW 01/18/2012  10:44 AM   Other:  Juline Patch, LCSW 01/18/2012  10:44 AM   Other:  Serena Colonel, NP 01/18/2012  10:44 AM   Other:     Other:      Scribe for  Treatment Team:   Carmina Miller, 01/18/2012 , 10:44 AM

## 2012-01-18 NOTE — [facility] Counselor (Signed)
[facility] Adult Inpatient Family/Significant Other Suicide Prevention Education  Suicide Prevention Education:  Education Completed;  Elon Jester, mother, [phone]) has been identified by the patient as the family member/significant other with whom the patient will be residing, and identified as the person(s) who will aid the patient in the event of a mental health crisis (suicidal ideations/suicide attempt).  With written consent from the patient, the family member/significant other has been provided the following suicide prevention education, prior to the and/or following the discharge of the patient.  The suicide prevention education provided includes the following:  Suicide risk factors  Suicide prevention and interventions  National Suicide Hotline telephone number  [facility] assessment telephone number  [facility] Emergency Assistance 911  [facility] and/or Residential Mobile Crisis Unit telephone number  Request made of family/significant other to:  Remove weapons (e.g., guns, rifles, knives), all items previously/currently identified as safety concern.    Remove drugs/medications (over-the-counter, prescriptions, illicit drugs), all items previously/currently identified as a safety concern.  Yolanda denied any safety concerns about Ellis being discharged and confirmed that he does not have access to guns. She stated that she is supportive of him continuing to see his therapist and psychiatrist, and asked questions to make sure he would have information about how/when to take his medications. Yolanda verbalized understanding of suicide prevention information and stated that she would call the hospital or 911 if needed in the future for crisis response. She had no further questions.   [name] 01/18/2012, 2:55 PM

## 2012-01-18 NOTE — Progress Notes (Signed)
[facility] Case Management Discharge Plan:  Will you be returning to the same living situation after discharge: Yes,  return home At discharge, do you have transportation home?:Yes,  access to transportation Do you have the ability to pay for your medications:Yes,  provided prescriptions  Interagency Information:     Release of information consent forms completed and in the chart;  Patient's signature needed at discharge.  Patient to Follow up at:  Follow-up Information    Follow up with Dr. Dellia Cloud  on 01/22/2012. (Appointment scheduled at 4:00 pm)    Contact information:   [address] [place], [place] [address] [phone]      Follow up with Velta Addison, NP on 02/13/2012. (Appointment scheduled at 10:15 am)    Contact information:   [address] [place], [place] [address] [phone]         Patient denies SI/HI:   Yes,  denies SI/HI today    Safety Planning and Suicide Prevention discussed:  Yes,  discussed with pt  Barrier to discharge identified:No.   Summary and Recommendations: Pt attended discharge planning group and actively participated.  Pt presents with calm mood and affect.  Pt ranks depression and anxiety at a 1 today. Pt denies SI/HI today.  Pt reports feeling stable to d/c.  No recommendations from SW.  No further needs voiced by pt.  Pt stable to discharge.     Carmina Miller 01/18/2012, 10:46 AM

## 2012-01-18 NOTE — Discharge Instructions (Signed)
Stay on medications.  S-adenosylmethionine (SAMe) is an OTC dietary supplement which is probably a safe and reasonably effective alternative treatment to NSAID's for osteoarthritis. He would like to try this.  This also may help the hot flashes associated with anxiety.

## 2012-01-18 NOTE — Progress Notes (Signed)
[facility] Group Notes: (Counselor/Nursing/MHT/Case Management/Adjunct) 01/18/2012   @  11:00am  Finding Balance in Life  Type of Therapy:  Group Therapy  Participation Level:  Limited  Participation Quality: Attentive    Affect:  Appropriate  Cognitive:  Appropriate  Insight:  None  Engagement in Group: Minimal  Engagement in Therapy:  None  Modes of Intervention:  Support and Exploration  Summary of Progress/Problems: Brandon Roberts was present and attentive, though he did not share personally.  [name] 01/18/2012   2:48 PM      [facility] Group Notes: (Counselor/Nursing/MHT/Case Management/Adjunct) 01/18/2012   @1 :15pm  Type of Therapy:  Group Therapy  Participation Level:  None  Participation Quality:  None    Affect:  Appropriate  Cognitive:  Appropriate  Insight:  None  Engagement in Group:  None  Engagement in Therapy:  None  Modes of Intervention:  Support and Exploration  Summary of Progress/Problems:  Brandon Roberts  was attentive but not engaged in group process    [name] 01/18/2012 2:49 PM
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
## 2012-01-18 NOTE — H&P (Signed)
Medical/psychiatric screening examination/treatment/procedure(s) were performed by non-physician practitioner and as supervising physician I was immediately available for consultation/collaboration.  I have seen and examined this patient and agree the major elements of this evaluation.  

## 2012-01-19 NOTE — Progress Notes (Signed)
Patient Discharge Instructions:  After Visit Summary (AVS):   Faxed to:  01/19/2012 Access to EMR:  01/19/2012 Psychiatric Admission Assessment Note:   Faxed to:  01/19/2012 Access to EMR:  01/19/2012 Suicide Risk Assessment - Discharge Assessment:   Faxed to:  01/19/2012 Access to EMR:  01/19/2012 Faxed/Sent to the Next Level Care provider:  01/19/2012 Next Level Care Provider Has Access to the EMR, 01/19/2012  Faxed to Velta Addison, NP @ [phone] And provided to [facility] - Dr. Dellia Cloud via [facility]/Epic access  Wandra Scot, 01/19/2012, 4:01 PM

## 2012-01-22 ENCOUNTER — Ambulatory Visit (INDEPENDENT_AMBULATORY_CARE_PROVIDER_SITE_OTHER): Payer: BC Managed Care – PPO | Admitting: Psychology

## 2012-01-22 DIAGNOSIS — F331 Major depressive disorder, recurrent, moderate: Secondary | ICD-10-CM

## 2012-01-25 NOTE — Discharge Summary (Signed)
I agree with this D/C Summary.  

## 2012-02-09 ENCOUNTER — Ambulatory Visit: Payer: Self-pay | Admitting: Psychology

## 2012-02-18 ENCOUNTER — Emergency Department (HOSPITAL_COMMUNITY)
Admission: EM | Admit: 2012-02-18 | Discharge: 2012-02-19 | Disposition: A | Discharge status: Home / Self Care | Payer: No Typology Code available for payment source | Attending: Emergency Medicine | Admitting: Emergency Medicine

## 2012-02-18 ENCOUNTER — Encounter (HOSPITAL_COMMUNITY): Payer: Self-pay | Admitting: *Deleted

## 2012-02-18 DIAGNOSIS — K589 Irritable bowel syndrome without diarrhea: Secondary | ICD-10-CM | POA: Insufficient documentation

## 2012-02-18 DIAGNOSIS — F988 Other specified behavioral and emotional disorders with onset usually occurring in childhood and adolescence: Secondary | ICD-10-CM | POA: Insufficient documentation

## 2012-02-18 DIAGNOSIS — R569 Unspecified convulsions: Secondary | ICD-10-CM | POA: Insufficient documentation

## 2012-02-18 DIAGNOSIS — F341 Dysthymic disorder: Secondary | ICD-10-CM | POA: Insufficient documentation

## 2012-02-18 DIAGNOSIS — Z79899 Other long term (current) drug therapy: Secondary | ICD-10-CM | POA: Insufficient documentation

## 2012-02-18 DIAGNOSIS — Y92410 Unspecified street and highway as the place of occurrence of the external cause: Secondary | ICD-10-CM | POA: Insufficient documentation

## 2012-02-18 NOTE — ED Notes (Signed)
Pt denies any pain. Pt not talking just shacking his head, yes & no.
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
## 2012-02-18 NOTE — ED Notes (Addendum)
Pt somewhat more alert. Attempted to place c-collar on pt.  Pt again refused. Pt denies pain in any location at this time.

## 2012-02-19 NOTE — ED Notes (Signed)
Family at bedside. Family would like to speak with the RN or Doctor states she has not talked to anyone since she has been here. RN Ron notified.

## 2012-02-19 NOTE — ED Notes (Signed)
Pt alert & oriented x4, stable gait. Pt given discharge instructions, paperwork. Patient instructed to stop at the registration desk to finish any additional paperwork. pt verbalized understanding. Pt left department w/ no further questions.  

## 2012-02-19 NOTE — ED Notes (Signed)
Family at bedside. 
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
## 2012-02-19 NOTE — ED Notes (Signed)
Patient is resting at this time. Family does not need anything
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
## 2012-02-21 ENCOUNTER — Emergency Department (HOSPITAL_COMMUNITY): Payer: No Typology Code available for payment source

## 2012-02-21 ENCOUNTER — Encounter (HOSPITAL_COMMUNITY): Payer: Self-pay | Admitting: *Deleted

## 2012-02-21 ENCOUNTER — Emergency Department (HOSPITAL_COMMUNITY)
Admission: EM | Admit: 2012-02-21 | Discharge: 2012-02-21 | Disposition: A | Discharge status: Home / Self Care | Payer: No Typology Code available for payment source | Attending: Emergency Medicine | Admitting: Emergency Medicine

## 2012-02-21 DIAGNOSIS — S39012A Strain of muscle, fascia and tendon of lower back, initial encounter: Secondary | ICD-10-CM

## 2012-02-21 DIAGNOSIS — Z8249 Family history of ischemic heart disease and other diseases of the circulatory system: Secondary | ICD-10-CM | POA: Insufficient documentation

## 2012-02-21 DIAGNOSIS — F329 Major depressive disorder, single episode, unspecified: Secondary | ICD-10-CM | POA: Insufficient documentation

## 2012-02-21 DIAGNOSIS — S139XXA Sprain of joints and ligaments of unspecified parts of neck, initial encounter: Secondary | ICD-10-CM | POA: Insufficient documentation

## 2012-02-21 DIAGNOSIS — K589 Irritable bowel syndrome without diarrhea: Secondary | ICD-10-CM | POA: Insufficient documentation

## 2012-02-21 DIAGNOSIS — S335XXA Sprain of ligaments of lumbar spine, initial encounter: Secondary | ICD-10-CM | POA: Insufficient documentation

## 2012-02-21 DIAGNOSIS — F411 Generalized anxiety disorder: Secondary | ICD-10-CM | POA: Insufficient documentation

## 2012-02-21 DIAGNOSIS — F988 Other specified behavioral and emotional disorders with onset usually occurring in childhood and adolescence: Secondary | ICD-10-CM | POA: Insufficient documentation

## 2012-02-21 DIAGNOSIS — R569 Unspecified convulsions: Secondary | ICD-10-CM | POA: Insufficient documentation

## 2012-02-21 DIAGNOSIS — F3289 Other specified depressive episodes: Secondary | ICD-10-CM | POA: Insufficient documentation

## 2012-02-21 DIAGNOSIS — S161XXA Strain of muscle, fascia and tendon at neck level, initial encounter: Secondary | ICD-10-CM

## 2012-02-21 MED ORDER — CYCLOBENZAPRINE HCL 5 MG PO TABS: 5.0000 mg | ORAL_TABLET | Freq: Three times a day (TID) | ORAL | Status: AC | PRN

## 2012-02-21 MED ORDER — IBUPROFEN 600 MG PO TABS: 600.0000 mg | ORAL_TABLET | Freq: Four times a day (QID) | ORAL | Status: AC | PRN

## 2012-02-21 NOTE — Discharge Instructions (Signed)
Cervical Strain Care After A cervical strain is when the muscles and ligaments in your neck have been stretched. The bones are not broken. If you had any problems moving your arms or legs immediately after the injury, even if the problem has gone away, make sure to tell this to your caregiver.  HOME CARE INSTRUCTIONS   While awake, apply ice packs to the neck or areas of pain about every 1 to 2 hours, for 15 to 20 minutes at a time. Do this for 2 days. If you were given a cervical collar for support, ask your caregiver if you may remove it for bathing or applying ice.   Only take over-the-counter or prescription medicines for pain, discomfort, or fever as directed by your caregiver.  SEEK IMMEDIATE MEDICAL CARE IF:   You have increasing pain in your neck.   You develop difficulties swallowing or breathing.   You have numbness, weakness, or movement problems in the arms or legs.   You have difficulty walking.   You develop bowel or bladder retention or incontinence.   You have problems with walking.  MAKE SURE YOU:   Understand these instructions.   Will watch your condition.   Will get help right away if you are not doing well or get worse.  Document Released: 08/21/2005 Document Revised: 05/03/2011 Document Reviewed: 04/03/2008 [facility] Patient Information 2012 [place], [place].   You may take the medicines prescribed for your pain and muscle spasm.  Call your doctor for a recheck if not better over the next week.
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
## 2012-02-26 NOTE — ED Provider Notes (Signed)
Medical screening examination/treatment/procedure(s) were performed by non-physician practitioner and as supervising physician I was immediately available for consultation/collaboration.   Charnelle Bergeman W Lucca Greggs, MD 02/26/12 0119 

## 2012-03-08 ENCOUNTER — Ambulatory Visit (INDEPENDENT_AMBULATORY_CARE_PROVIDER_SITE_OTHER): Payer: No Typology Code available for payment source | Admitting: Psychology

## 2012-03-08 DIAGNOSIS — F331 Major depressive disorder, recurrent, moderate: Secondary | ICD-10-CM

## 2012-03-13 ENCOUNTER — Ambulatory Visit (INDEPENDENT_AMBULATORY_CARE_PROVIDER_SITE_OTHER): Payer: No Typology Code available for payment source | Admitting: Psychology

## 2012-03-13 DIAGNOSIS — F331 Major depressive disorder, recurrent, moderate: Secondary | ICD-10-CM

## 2012-03-15 ENCOUNTER — Ambulatory Visit (INDEPENDENT_AMBULATORY_CARE_PROVIDER_SITE_OTHER): Payer: No Typology Code available for payment source | Admitting: Psychology

## 2012-03-15 DIAGNOSIS — F331 Major depressive disorder, recurrent, moderate: Secondary | ICD-10-CM

## 2012-03-18 ENCOUNTER — Ambulatory Visit (INDEPENDENT_AMBULATORY_CARE_PROVIDER_SITE_OTHER): Payer: No Typology Code available for payment source | Admitting: Psychology

## 2012-03-18 DIAGNOSIS — F331 Major depressive disorder, recurrent, moderate: Secondary | ICD-10-CM

## 2012-03-26 ENCOUNTER — Ambulatory Visit: Payer: No Typology Code available for payment source | Admitting: Psychology

## 2012-04-24 ENCOUNTER — Ambulatory Visit: Payer: Self-pay | Admitting: Psychology

## 2012-05-03 ENCOUNTER — Ambulatory Visit (INDEPENDENT_AMBULATORY_CARE_PROVIDER_SITE_OTHER): Payer: BC Managed Care – PPO | Admitting: Psychology

## 2012-05-03 DIAGNOSIS — F331 Major depressive disorder, recurrent, moderate: Secondary | ICD-10-CM

## 2012-05-16 ENCOUNTER — Ambulatory Visit (INDEPENDENT_AMBULATORY_CARE_PROVIDER_SITE_OTHER): Payer: BC Managed Care – PPO | Admitting: Psychology

## 2012-05-16 DIAGNOSIS — F331 Major depressive disorder, recurrent, moderate: Secondary | ICD-10-CM

## 2012-05-27 ENCOUNTER — Ambulatory Visit: Payer: Self-pay | Admitting: Psychology

## 2012-06-24 ENCOUNTER — Ambulatory Visit: Payer: Self-pay | Admitting: Psychology

## 2012-07-24 ENCOUNTER — Ambulatory Visit (INDEPENDENT_AMBULATORY_CARE_PROVIDER_SITE_OTHER): Payer: BC Managed Care – PPO | Admitting: Psychology

## 2012-07-24 DIAGNOSIS — F331 Major depressive disorder, recurrent, moderate: Secondary | ICD-10-CM

## 2012-08-08 ENCOUNTER — Ambulatory Visit: Payer: Self-pay | Admitting: Psychology

## 2012-08-20 ENCOUNTER — Ambulatory Visit (INDEPENDENT_AMBULATORY_CARE_PROVIDER_SITE_OTHER): Payer: BC Managed Care – PPO | Admitting: Psychology

## 2012-08-20 DIAGNOSIS — F331 Major depressive disorder, recurrent, moderate: Secondary | ICD-10-CM

## 2012-09-05 ENCOUNTER — Ambulatory Visit: Payer: BC Managed Care – PPO | Admitting: Psychology

## 2012-09-27 ENCOUNTER — Ambulatory Visit: Payer: Self-pay | Admitting: Psychology

## 2012-10-02 ENCOUNTER — Ambulatory Visit: Payer: Self-pay | Admitting: Psychology

## 2012-11-07 ENCOUNTER — Ambulatory Visit (INDEPENDENT_AMBULATORY_CARE_PROVIDER_SITE_OTHER): Payer: BC Managed Care – PPO | Admitting: Psychology

## 2012-11-07 DIAGNOSIS — F331 Major depressive disorder, recurrent, moderate: Secondary | ICD-10-CM

## 2012-11-21 ENCOUNTER — Encounter (HOSPITAL_COMMUNITY): Payer: Self-pay | Admitting: *Deleted

## 2012-11-21 ENCOUNTER — Ambulatory Visit (HOSPITAL_COMMUNITY)
Admission: RE | Admit: 2012-11-21 | Discharge: 2012-11-21 | Disposition: A | Discharge status: Home / Self Care | Payer: BC Managed Care – PPO | Attending: Psychiatry | Admitting: Psychiatry

## 2012-11-21 ENCOUNTER — Ambulatory Visit (INDEPENDENT_AMBULATORY_CARE_PROVIDER_SITE_OTHER): Payer: BC Managed Care – PPO | Admitting: Psychology

## 2012-11-21 DIAGNOSIS — F909 Attention-deficit hyperactivity disorder, unspecified type: Secondary | ICD-10-CM | POA: Insufficient documentation

## 2012-11-21 DIAGNOSIS — F4001 Agoraphobia with panic disorder: Secondary | ICD-10-CM | POA: Insufficient documentation

## 2012-11-21 DIAGNOSIS — F331 Major depressive disorder, recurrent, moderate: Secondary | ICD-10-CM

## 2012-11-21 DIAGNOSIS — F3160 Bipolar disorder, current episode mixed, unspecified: Secondary | ICD-10-CM | POA: Insufficient documentation

## 2012-11-21 HISTORY — DX: Syncope and collapse: R55
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
## 2012-11-27 ENCOUNTER — Other Ambulatory Visit (HOSPITAL_COMMUNITY): Payer: BC Managed Care – PPO

## 2012-11-27 ENCOUNTER — Encounter (HOSPITAL_COMMUNITY): Payer: Self-pay
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
## 2012-11-28 ENCOUNTER — Ambulatory Visit: Payer: BC Managed Care – PPO | Admitting: Psychology

## 2012-11-28 ENCOUNTER — Other Ambulatory Visit (HOSPITAL_COMMUNITY): Payer: BC Managed Care – PPO

## 2012-11-29 ENCOUNTER — Other Ambulatory Visit (HOSPITAL_COMMUNITY): Payer: BC Managed Care – PPO

## 2012-12-02 ENCOUNTER — Other Ambulatory Visit (HOSPITAL_COMMUNITY): Payer: BC Managed Care – PPO

## 2012-12-03 ENCOUNTER — Ambulatory Visit (INDEPENDENT_AMBULATORY_CARE_PROVIDER_SITE_OTHER): Payer: BC Managed Care – PPO | Admitting: Psychology

## 2012-12-03 ENCOUNTER — Other Ambulatory Visit (HOSPITAL_COMMUNITY): Payer: BC Managed Care – PPO

## 2012-12-03 DIAGNOSIS — F331 Major depressive disorder, recurrent, moderate: Secondary | ICD-10-CM

## 2012-12-03 NOTE — Progress Notes (Unsigned)
This encounter was created in error - please disregard.

## 2012-12-04 ENCOUNTER — Other Ambulatory Visit (HOSPITAL_COMMUNITY): Payer: BC Managed Care – PPO

## 2012-12-05 ENCOUNTER — Other Ambulatory Visit (HOSPITAL_COMMUNITY): Payer: BC Managed Care – PPO

## 2012-12-06 ENCOUNTER — Other Ambulatory Visit (HOSPITAL_COMMUNITY): Payer: BC Managed Care – PPO

## 2012-12-09 ENCOUNTER — Other Ambulatory Visit (HOSPITAL_COMMUNITY): Payer: BC Managed Care – PPO

## 2012-12-10 ENCOUNTER — Other Ambulatory Visit (HOSPITAL_COMMUNITY): Payer: BC Managed Care – PPO

## 2012-12-10 ENCOUNTER — Inpatient Hospital Stay (HOSPITAL_COMMUNITY)
Admission: RE | Admit: 2012-12-10 | Discharge: 2012-12-13 | DRG: 430 | Disposition: A | Discharge status: Home / Self Care | Payer: BC Managed Care – PPO | Attending: Psychiatry | Admitting: Psychiatry

## 2012-12-10 ENCOUNTER — Encounter (HOSPITAL_COMMUNITY): Payer: Self-pay | Admitting: *Deleted

## 2012-12-10 DIAGNOSIS — F988 Other specified behavioral and emotional disorders with onset usually occurring in childhood and adolescence: Secondary | ICD-10-CM | POA: Diagnosis present

## 2012-12-10 DIAGNOSIS — F411 Generalized anxiety disorder: Secondary | ICD-10-CM | POA: Diagnosis present

## 2012-12-10 DIAGNOSIS — F4310 Post-traumatic stress disorder, unspecified: Secondary | ICD-10-CM | POA: Diagnosis present

## 2012-12-10 DIAGNOSIS — F329 Major depressive disorder, single episode, unspecified: Secondary | ICD-10-CM

## 2012-12-10 DIAGNOSIS — F3110 Bipolar disorder, current episode manic without psychotic features, unspecified: Principal | ICD-10-CM | POA: Diagnosis present

## 2012-12-10 DIAGNOSIS — F319 Bipolar disorder, unspecified: Secondary | ICD-10-CM

## 2012-12-10 DIAGNOSIS — Z79899 Other long term (current) drug therapy: Secondary | ICD-10-CM

## 2012-12-10 DIAGNOSIS — R55 Syncope and collapse: Secondary | ICD-10-CM | POA: Diagnosis present

## 2012-12-10 MED ORDER — HYDROXYZINE HCL 50 MG PO TABS: 50.0000 mg | ORAL_TABLET | Freq: Three times a day (TID) | ORAL | Status: DC | PRN

## 2012-12-10 MED ORDER — ALUM & MAG HYDROXIDE-SIMETH 200-200-20 MG/5ML PO SUSP: 30.0000 mL | ORAL | Status: DC | PRN

## 2012-12-10 MED ORDER — ACETAMINOPHEN 325 MG PO TABS: 650.0000 mg | ORAL_TABLET | Freq: Four times a day (QID) | ORAL | Status: DC | PRN

## 2012-12-10 MED ORDER — TRAZODONE HCL 50 MG PO TABS
50.0000 mg | ORAL_TABLET | Freq: Every evening | ORAL | Status: DC | PRN
  Administered 2012-12-10 – 2012-12-12 (×3): 50 mg via ORAL
  Filled 2012-12-10: qty 8
  Filled 2012-12-10 (×9): qty 1
  Filled 2012-12-10: qty 8

## 2012-12-10 MED ORDER — MAGNESIUM HYDROXIDE 400 MG/5ML PO SUSP: 30.0000 mL | Freq: Every day | ORAL | Status: DC | PRN

## 2012-12-10 NOTE — Tx Team (Signed)
Initial Interdisciplinary Treatment Plan  PATIENT STRENGTHS: (choose at least two) Ability for insight Average or above average intelligence Communication skills General fund of knowledge Motivation for treatment/growth Physical Health Supportive family/friends  PATIENT STRESSORS: Medication change or noncompliance   PROBLEM LIST: Problem List/Patient Goals Date to be addressed Date deferred Reason deferred Estimated date of resolution  "Help with anxiety and depression" 12/10/12           Increased risk for suicide 12/10/12     depression 12/10/12     anxiety 12/10/12                              DISCHARGE CRITERIA:  Ability to meet basic life and health needs Adequate post-discharge living arrangements Improved stabilization in mood, thinking, and/or behavior Medical problems require only outpatient monitoring Motivation to continue treatment in a less acute level of care Need for constant or close observation no longer present Reduction of life-threatening or endangering symptoms to within safe limits Safe-care adequate arrangements made Verbal commitment to aftercare and medication compliance  PRELIMINARY DISCHARGE PLAN: Attend aftercare/continuing care group Outpatient therapy Participate in family therapy Return to previous living arrangement  PATIENT/FAMIILY INVOLVEMENT: This treatment plan has been presented to and reviewed with the patient, Brandon Roberts, and/or family member.  The patient and family have been given the opportunity to ask questions and make suggestions.  Brandon Roberts 12/10/2012, 8:20 PM
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
## 2012-12-10 NOTE — Progress Notes (Signed)
Pt was pleasant and cooperative, but slightly anxious and guarded during the admission process. Writer asked pt circumstances surrounding his adm. Pt stated, "Blacking out, well psychosis, whatever". Pt stated he'd been "passing out and seeing things for 2 months". Stated that "last night it got bad". Stated he couldn't remember anything, "It was like I hit a reset button". Pt just recently dislocated his left shoulder playing basketball. Stated he was given a sling after his hospital visit, but doesn't use it. Pt also has a heart monitor implant that was implanted 3 yrs ago. Pt was last here in March 2014.

## 2012-12-11 ENCOUNTER — Other Ambulatory Visit (HOSPITAL_COMMUNITY): Payer: BC Managed Care – PPO

## 2012-12-11 DIAGNOSIS — F3110 Bipolar disorder, current episode manic without psychotic features, unspecified: Principal | ICD-10-CM

## 2012-12-11 DIAGNOSIS — F4310 Post-traumatic stress disorder, unspecified: Secondary | ICD-10-CM

## 2012-12-11 LAB — URINALYSIS, ROUTINE W REFLEX MICROSCOPIC
Ketones, ur: NEGATIVE mg/dL
Leukocytes, UA: NEGATIVE
Nitrite: NEGATIVE
Urobilinogen, UA: 1 mg/dL (ref 0.0–1.0)
pH: 6.5 (ref 5.0–8.0)

## 2012-12-11 LAB — COMPREHENSIVE METABOLIC PANEL
Alkaline Phosphatase: 142 U/L — ABNORMAL HIGH (ref 39–117)
BUN: 14 mg/dL (ref 6–23)
CO2: 30 mEq/L (ref 19–32)
Chloride: 101 mEq/L (ref 96–112)
Creatinine, Ser: 0.83 mg/dL (ref 0.50–1.35)
GFR calc non Af Amer: >90 mL/min (ref >90)
Glucose, Bld: 104 mg/dL — ABNORMAL HIGH (ref 70–99)
Total Bilirubin: 0.2 mg/dL — ABNORMAL LOW (ref 0.3–1.2)

## 2012-12-11 LAB — URINE MICROSCOPIC-ADD ON

## 2012-12-11 LAB — CBC
HCT: 50.3 % (ref 39.0–52.0)
Hemoglobin: 16.7 g/dL (ref 13.0–17.0)
MCV: 87.2 fL (ref 78.0–100.0)
RBC: 5.77 MIL/uL (ref 4.22–5.81)
RDW: 13.1 % (ref 11.5–15.5)
WBC: 7.6 10*3/uL (ref 4.0–10.5)

## 2012-12-11 LAB — RAPID URINE DRUG SCREEN, HOSP PERFORMED: Benzodiazepines: NOT DETECTED

## 2012-12-11 MED ORDER — ONDANSETRON 4 MG PO TBDP: 4.0000 mg | ORAL_TABLET | Freq: Four times a day (QID) | ORAL | Status: DC | PRN

## 2012-12-11 MED ORDER — ADULT MULTIVITAMIN W/MINERALS CH
1.0000 | ORAL_TABLET | Freq: Every day | ORAL | Status: DC
  Administered 2012-12-11 – 2012-12-13 (×3): 1 via ORAL
  Filled 2012-12-11 (×5): qty 1

## 2012-12-11 MED ORDER — DIVALPROEX SODIUM ER 500 MG PO TB24
1000.0000 mg | ORAL_TABLET | Freq: Every day | ORAL | Status: DC
  Administered 2012-12-11: 1000 mg via ORAL
  Filled 2012-12-11 (×4): qty 2

## 2012-12-11 MED ORDER — HYDROXYZINE HCL 25 MG PO TABS: 25.0000 mg | ORAL_TABLET | Freq: Four times a day (QID) | ORAL | Status: DC | PRN

## 2012-12-11 MED ORDER — THIAMINE HCL 100 MG/ML IJ SOLN
100.0000 mg | Freq: Once | INTRAMUSCULAR | Status: AC
  Administered 2012-12-11: 100 mg via INTRAMUSCULAR

## 2012-12-11 MED ORDER — VITAMIN B-1 100 MG PO TABS
100.0000 mg | ORAL_TABLET | Freq: Every day | ORAL | Status: DC
  Administered 2012-12-12 – 2012-12-13 (×2): 100 mg via ORAL
  Filled 2012-12-11 (×4): qty 1

## 2012-12-11 MED ORDER — LAMOTRIGINE 100 MG PO TABS
300.0000 mg | ORAL_TABLET | Freq: Every day | ORAL | Status: DC
  Administered 2012-12-11 – 2012-12-12 (×2): 300 mg via ORAL
  Filled 2012-12-11: qty 2
  Filled 2012-12-11: qty 12
  Filled 2012-12-11: qty 2
  Filled 2012-12-11 (×2): qty 6
  Filled 2012-12-11: qty 2

## 2012-12-11 MED ORDER — CHLORDIAZEPOXIDE HCL 25 MG PO CAPS: 25.0000 mg | ORAL_CAPSULE | Freq: Four times a day (QID) | ORAL | Status: DC | PRN

## 2012-12-11 MED ORDER — LOPERAMIDE HCL 2 MG PO CAPS: 2.0000 mg | ORAL_CAPSULE | ORAL | Status: DC | PRN

## 2012-12-11 MED ORDER — IBUPROFEN 800 MG PO TABS
800.0000 mg | ORAL_TABLET | Freq: Four times a day (QID) | ORAL | Status: DC | PRN
  Administered 2012-12-11 – 2012-12-12 (×3): 800 mg via ORAL
  Filled 2012-12-11 (×3): qty 1

## 2012-12-11 NOTE — Progress Notes (Signed)
Recreation Therapy Notes   Date: 04.09.2014  Time: 9:30am  Location: [address] Day Room   Group Topic/Focus: Memory   Participation Level:  Active   Participation Quality:  Appropriate   Affect:  Euthymic   Cognitive:  Oriented   Additional Comments: Patient with peers played a "Catch the Balloon" a game that requires patients to keep a beach ball afloat. Each participant is required to state someone in the groups name as they tap the ball to keep it afloat. The person's name who is called is then responsible for keeping the ball afloat and stating another member of the group's name. The game continues until the ball hits the ground. Patient successfully kept the ball afloat, but forgot to name someone's name approximately 50% of the time. Patient played adapted "Memory." Game was played in 5 rounds. For 1st three rounds LRT held 4 cards in her hand, for last two rounds LRT held 5 cards to be identified. Each patient got to view the cards for 5 seconds. As a group patients were responsible for identifying the cards that were shown to them. Patient contributed answers to help group identify cards held by LRT. Patient actively participated in group session.   Marykay Lex Guillermo Nehring, LRT/CTRS  Madeleyn Schwimmer L 12/11/2012 2:10 PM

## 2012-12-11 NOTE — Progress Notes (Signed)
Pt reports his day has been fine.  He attended groups.  He has been med compliant.  He denies SI/HI/AV.  He is asking about when he can be discharged.  Informed pt that discharge is determined by the MD in consult with treatment team.  He would need to discuss this with the MD and the CM.  He plans to return home at discharge.  Pt has been pleasant/appropriate this evening.  Pt voices no other needs or concerns.  Support/encouragement offered.  Safety maintained with q15 minute checks.
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
## 2012-12-11 NOTE — Progress Notes (Signed)
D: Patient denies SI/HI and auditory and visual hallucinations. The patient has an anxious mood and affect. The patient reports sleeping fairly well and states that his appetite and energy level are improving. The patient is attending groups but has minimal interaction within the milieu. The patient also isolates to his room at times. Patient requesting discharge tomorrow in order to attend his daughter's school/extracurricular event.  A: Patient given emotional support from RN. Patient encouraged to come to staff with concerns and/or questions. Patient's medication routine continued. Patient's orders and plan of care reviewed. Patient referred to MD for discharge questions.  R: Patient remains cooperative. MD met with patient to discuss discharge questions. Will continue to monitor patient q15 minutes for safety.

## 2012-12-11 NOTE — Progress Notes (Signed)
The focus of this group is to help patients review their daily goal of treatment and discuss progress on daily workbooks. Pt attended the evening group session, but responded minimally to discussion prompts. Pt appeared very shy and could not state anything positive from his day or state a short term goal. Pt also stated he had no needs from staff this evening. When spoken to, Pt smiled, bit his nails and averted his eyes.

## 2012-12-11 NOTE — Progress Notes (Signed)
Adult Psychoeducational Group Note  Date:  12/11/2012 Time:  11:46 AM  Group Topic/Focus:  Crisis Planning:   The purpose of this group is to help patients create a crisis plan for use upon discharge or in the future, as needed.  Participation Level:  Active  Participation Quality:  Appropriate  Affect:  Appropriate  Cognitive:  Alert and Appropriate  Insight: Appropriate and Good  Engagement in Group:  Engaged  Modes of Intervention:  Discussion, Education and Support  Additional Comments:  Pt actively participated in group by completing the "Crisis Plan" worksheet. Pt shared that others may be able to tell that he is in a mental health crisis if he is "talking too much." Staff explored what pt meant by this statement. Pt shared that this behavior is indicative of racing thoughts.   Reinaldo Raddle K 12/11/2012, 11:46 AM

## 2012-12-11 NOTE — [facility] Suicide Risk Assessment (Signed)
Suicide Risk Assessment  Admission Assessment     Nursing information obtained from:  Patient Demographic factors:  Male;Unemployed Current Mental Status:  NA Loss Factors:  NA Historical Factors:  NA Risk Reduction Factors:  Responsible for children under 23 years of age;Sense of responsibility to family;Living with another person, especially a relative;Positive social support  CLINICAL FACTORS:   Severe Anxiety and/or Agitation Depression:   Anhedonia Insomnia Currently Psychotic Previous Psychiatric Diagnoses and Treatments  COGNITIVE FEATURES THAT CONTRIBUTE TO RISK:  Closed-mindedness    SUICIDE RISK:   Minimal: No identifiable suicidal ideation.  Patients presenting with no risk factors but with morbid ruminations; may be classified as minimal risk based on the severity of the depressive symptoms  PLAN OF CARE:1. Admit for crisis management and stabilization. 2. Medication management to reduce current symptoms to base line and improve the  patient's overall level of functioning 3. Treat health problems as indicated. 4. Develop treatment plan to decrease risk of relapse upon discharge and the need for readmission. 5. Psycho-social education regarding relapse prevention and self care. 6. Health care follow up as needed for medical problems. 7. Restart home medications where appropriate.   I certify that inpatient services furnished can reasonably be expected to improve the patient's condition.  Margrett Kalb,MD 12/11/2012, 9:17 AM

## 2012-12-11 NOTE — [facility] Group Notes (Signed)
[facility] LCSW Aftercare Discharge Planning Group Note   12/11/2012 10:18 AM  Participation Quality:  Engaged  Mood/Affect:  Flat  Depression Rating:  unknown  Anxiety Rating:  unknown  Thoughts of Suicide:  No Will you contract for safety?   NA  Current AVH:  No  Plan for Discharge/Comments:  Plans to return home, follow up with current providers  Transportation Means: car  Supports: mothers of his children  Brandon Roberts, Brandon Roberts

## 2012-12-11 NOTE — Progress Notes (Signed)
D:  Pt is pleasant and cooperative.   A: Pt was offered support and encouragement. Pt was given scheduled medications Q 15 minute checks were done for safety. Pt was given Trazodone for sleep. R:. Pt is taking medication. Pt has no complaints at this time.Pt receptive to treatment and safety maintained on unit.
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
## 2012-12-11 NOTE — [facility] Group Notes (Signed)
[facility] LCSW Group Therapy  07/19/2012 12:00 PM  Type of Therapy:  Group Therapy  Participation Level:  Minimal  Participation Quality:  Appropriate  Affect:  Appropriate  Cognitive:  Appropriate  Insight:  Engaged  Engagement in Group:  Engaged  Engagement in Therapy:  Limited  Modes of Intervention:  Discussion, Education, Socialization and Support  Summary of Progress/Problems: MHA: Patient was attentive with speaker from [employer]. Patient sat quietly as speaker talked about personal experiences with mental illness and services provided at [facility]. Pt smiled and listened intently throughout guitar performance. He clapped after each song and thanked speaker for playing.    Smart, Heather N 07/19/2012, 12:00 PM
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
## 2012-12-12 ENCOUNTER — Other Ambulatory Visit (HOSPITAL_COMMUNITY): Payer: BC Managed Care – PPO

## 2012-12-12 DIAGNOSIS — F319 Bipolar disorder, unspecified: Secondary | ICD-10-CM

## 2012-12-12 DIAGNOSIS — F411 Generalized anxiety disorder: Secondary | ICD-10-CM

## 2012-12-12 LAB — T4, FREE: Free T4: 0.94 ng/dL (ref 0.80–1.80)

## 2012-12-12 LAB — TSH: TSH: 0.945 u[IU]/mL (ref 0.350–4.500)

## 2012-12-12 MED ORDER — TOPIRAMATE 25 MG PO TABS
25.0000 mg | ORAL_TABLET | Freq: Two times a day (BID) | ORAL | Status: DC
  Administered 2012-12-12 – 2012-12-13 (×2): 25 mg via ORAL
  Filled 2012-12-12: qty 8
  Filled 2012-12-12: qty 1
  Filled 2012-12-12: qty 8
  Filled 2012-12-12 (×3): qty 1

## 2012-12-12 MED ORDER — BUSPIRONE HCL 5 MG PO TABS
7.5000 mg | ORAL_TABLET | Freq: Two times a day (BID) | ORAL | Status: DC
  Administered 2012-12-12 – 2012-12-13 (×2): 7.5 mg via ORAL
  Filled 2012-12-12 (×2): qty 1
  Filled 2012-12-12: qty 12
  Filled 2012-12-12 (×2): qty 1
  Filled 2012-12-12: qty 12

## 2012-12-12 NOTE — B[facility] Group Notes (Signed)
[facility] LCSW Aftercare Discharge Planning Group Note   12/12/2012 9:51 AM  Participation Quality:  Engaged  Mood/Affect:  Blunted  Depression Rating:  Denies  Anxiety Rating:  Denies  Thoughts of Suicide:  No Will you contract for safety?   NA  Current AVH:  No  Plan for Discharge/Comments:  Brandon Roberts is hoping to d/c soon.  He reports no side effects from the depakote.  Transportation Means: has car  Supports: family  [name], [name]

## 2012-12-12 NOTE — Progress Notes (Signed)
D:  Patient up and active in the milieu.  Attending and participating in groups.  Denies suicidal ideation, depression, or hopelessness.  Complained of shoulder pain once and requested ibuprofen.  Appetite is good.  A:  Medications given as prescribed.  Safety checks maintained.  R:  Pleasant and cooperative.  Interacting well with staff and peers.  Tolerating medications well.  Safety is maintained on the unit at this time.

## 2012-12-12 NOTE — Clinical Social Work Note (Signed)
[facility] Group Notes:  (Counselor/Nursing/MHT/Case Management/Adjunct)  07/18/2012 2:20 PM  Type of Therapy:  Group Therapy  Participation Level:  Minimal  Participation Quality:  Attentive  Affect:  Flat  Cognitive:  Oriented  Insight:  Improving  Engagement in Group:  Limited  Engagement in Therapy:  Improving  Modes of Intervention:  Discussion, Exploration and Socialization  Summary of Progress/Problems: .balance: The topic for group was balance in life.  Pt participated in the discussion about when their life was in balance and out of balance and how this feels.  Pt discussed ways to get back in balance and short term goals they can work on to get where they want to be. Brandon Roberts shared that a time he was unbalanced was when his son died in child birth.  He was not able to identify any ways of regaining balance, other than playing basketball.  Minimal participation.     Brandon Roberts B 07/18/2012, 2:20 PM

## 2012-12-12 NOTE — Progress Notes (Signed)
Patient ID: NIC LAMPE, male   DOB: 05-12-1991, 22 y.o.   MRN: [id number]  D: Pt denies SI/HI/AVH. Pt is pleasant and cooperative. Pt went to [place] today. Pt complains of L-shoulder pain that's 6 out of 10. Pt states he had a good day today.   A: Pt was offered support and encouragement. Pt was given scheduled medications. Pt was encourage to attend groups. Q 15 minute checks were done for safety.   R:Pt attends groups and interacts well with peers and staff. Pt is taking medication. Pt receptive to treatment and safety maintained on unit.

## 2012-12-12 NOTE — Progress Notes (Signed)
Brandon Roberts  MRN:  [id number] Subjective:  I'm better" patient states. Objective: [name] looks calmer today, has good eye contact, is cooperative and informative. Did discuss the medication changes to include stopping the Depakote for a more weight neutral drug Topamax and adding Risperdal for anxiety. We also discussed my conversation with Dr. Dellia Cloud and pending psychological testing that is being ordered by Dr. Dellia Cloud. Diagnosis:  Bipolar Affective disorder                      GAD severe  ADL's:  Intact  Sleep: Good  Appetite:  Good  Suicidal Ideation:  denies Homicidal Ideation:  denies AEB (as evidenced by):Patient's report of decreasing symptoms, improved mood, appetite, sleep, and his ability to return to baseline functioning.  Psychiatric Specialty Exam: Review of Systems  Constitutional: Negative.  Negative for fever, chills, weight loss, malaise/fatigue and diaphoresis.  HENT: Negative for congestion and sore throat.   Eyes: Negative for blurred vision, double vision and photophobia.  Respiratory: Negative for cough, shortness of breath and wheezing.   Cardiovascular: Negative for chest pain, palpitations and PND.  Gastrointestinal: Negative for heartburn, nausea, vomiting, abdominal pain, diarrhea and constipation.  Musculoskeletal: Negative for myalgias, joint pain and falls.  Neurological: Negative for dizziness, tingling, tremors, sensory change, speech change, focal weakness, seizures, loss of consciousness, weakness and headaches.  Endo/Heme/Allergies: Negative for polydipsia. Does not bruise/bleed easily.  Psychiatric/Behavioral: Negative for depression, suicidal ideas, hallucinations, memory loss and substance abuse. The patient is not nervous/anxious and does not have insomnia.     Blood pressure 128/88, pulse 87, temperature 98.1 F (36.7 C), temperature source Oral, resp. rate 17, height 5' 5.75" (1.67 m), weight  92.987 kg (205 lb).Body mass index is 33.34 kg/(m^2).  General Appearance: Fairly Groomed  Patent attorney::  Good  Speech:  Clear and Coherent  Volume:  Normal  Mood:  Anxious  Affect:  Congruent  Thought Process:  Linear  Orientation:  Full (Time, Place, and Person)  Thought Content:  NA and Hallucinations: Auditory  Suicidal Thoughts:  Yes.  without intent/plan  Homicidal Thoughts:  No  Memory:  Negative  Judgement:  Intact  Insight:  improving.  Psychomotor Activity:  Tremor  Concentration:  Fair  Recall:  Fair  Akathisia:  No  Handed:  Right  AIMS (if indicated):     Assets:  Social Support  Sleep:  Number of Hours: 5.75   Current Medications: Current Facility-Administered Medications  Medication Dose Route Frequency Provider Last Rate Last Dose  . acetaminophen (TYLENOL) tablet 650 mg  650 mg Oral Q6H PRN Verne Spurr, PA-C      . alum & mag hydroxide-simeth (MAALOX/MYLANTA) 200-200-20 MG/5ML suspension 30 mL  30 mL Oral Q4H PRN Verne Spurr, PA-C      . busPIRone (BUSPAR) tablet 7.5 mg  7.5 mg Oral BID Verne Spurr, PA-C      . chlordiazePOXIDE (LIBRIUM) capsule 25 mg  25 mg Oral Q6H PRN Verne Spurr, PA-C      . hydrOXYzine (ATARAX/VISTARIL) tablet 25 mg  25 mg Oral Q6H PRN Verne Spurr, PA-C      . ibuprofen (ADVIL,MOTRIN) tablet 800 mg  800 mg Oral Q6H PRN Verne Spurr, PA-C   800 mg at 12/12/12 1152  . lamoTRIgine (LAMICTAL) tablet 300 mg  300 mg Oral QHS Verne Spurr, PA-C   300 mg at 12/11/12 2146  . loperamide (IMODIUM) capsule 2-4 mg  2-4  mg Oral PRN Verne Spurr, PA-C      . magnesium hydroxide (MILK OF MAGNESIA) suspension 30 mL  30 mL Oral Daily PRN Verne Spurr, PA-C      . multivitamin with minerals tablet 1 tablet  1 tablet Oral Daily Verne Spurr, PA-C   1 tablet at 12/12/12 1610  . ondansetron (ZOFRAN-ODT) disintegrating tablet 4 mg  4 mg Oral Q6H PRN Verne Spurr, PA-C      . thiamine (VITAMIN B-1) tablet 100 mg  100 mg Oral Daily Verne Spurr,  PA-C   100 mg at 12/12/12 9604  . topiramate (TOPAMAX) tablet 25 mg  25 mg Oral BID Verne Spurr, PA-C      . traZODone (DESYREL) tablet 50 mg  50 mg Oral QHS,MR X 1 Verne Spurr, PA-C   50 mg at 12/11/12 2146    Lab Results:  Results for orders placed during the hospital encounter of 12/10/12 (from the past 48 hour(s))  URINALYSIS, ROUTINE W REFLEX MICROSCOPIC     Status: Abnormal   Collection Time    12/11/12  7:56 AM      Result Value Range   Color, Urine YELLOW  YELLOW   APPearance CLEAR  CLEAR   Specific Gravity, Urine 1.020  1.005 - 1.030   pH 6.5  5.0 - 8.0   Glucose, UA NEGATIVE  NEGATIVE mg/dL   Hgb urine dipstick TRACE (*) NEGATIVE   Bilirubin Urine NEGATIVE  NEGATIVE   Ketones, ur NEGATIVE  NEGATIVE mg/dL   Protein, ur NEGATIVE  NEGATIVE mg/dL   Urobilinogen, UA 1.0  0.0 - 1.0 mg/dL   Nitrite NEGATIVE  NEGATIVE   Leukocytes, UA NEGATIVE  NEGATIVE  URINE RAPID DRUG SCREEN (HOSP PERFORMED)     Status: Abnormal   Collection Time    12/11/12  7:56 AM      Result Value Range   Opiates POSITIVE (*) NONE DETECTED   Cocaine NONE DETECTED  NONE DETECTED   Benzodiazepines NONE DETECTED  NONE DETECTED   Amphetamines NONE DETECTED  NONE DETECTED   Tetrahydrocannabinol POSITIVE (*) NONE DETECTED   Barbiturates NONE DETECTED  NONE DETECTED   Comment:            DRUG SCREEN FOR MEDICAL PURPOSES     ONLY.  IF CONFIRMATION IS NEEDED     FOR ANY PURPOSE, NOTIFY LAB     WITHIN 5 DAYS.                LOWEST DETECTABLE LIMITS     FOR URINE DRUG SCREEN     Drug Class       Cutoff (ng/mL)     Amphetamine      1000     Barbiturate      200     Benzodiazepine   200     Tricyclics       300     Opiates          300     Cocaine          300     THC              50  URINE MICROSCOPIC-ADD ON     Status: Abnormal   Collection Time    12/11/12  7:56 AM      Result Value Range   Squamous Epithelial / LPF RARE  RARE   RBC / HPF 0-2  <3 RBC/hpf   Bacteria, UA RARE  RARE   Crystals  CA OXALATE CRYSTALS (*) NEGATIVE   Urine-Other MUCOUS PRESENT    CBC     Status: None   Collection Time    12/11/12  7:42 PM      Result Value Range   WBC 7.6  4.0 - 10.5 K/uL   RBC 5.77  4.22 - 5.81 MIL/uL   Hemoglobin 16.7  13.0 - 17.0 g/dL   HCT 40.9  81.1 - 91.4 %   MCV 87.2  78.0 - 100.0 fL   MCH 28.9  26.0 - 34.0 pg   MCHC 33.2  30.0 - 36.0 g/dL   RDW 78.2  95.6 - 21.3 %   Platelets 304  150 - 400 K/uL  COMPREHENSIVE METABOLIC PANEL     Status: Abnormal   Collection Time    12/11/12  7:42 PM      Result Value Range   Sodium 142  135 - 145 mEq/L   Potassium 4.3  3.5 - 5.1 mEq/L   Chloride 101  96 - 112 mEq/L   CO2 30  19 - 32 mEq/L   Glucose, Bld 104 (*) 70 - 99 mg/dL   BUN 14  6 - 23 mg/dL   Creatinine, Ser 0.86  0.50 - 1.35 mg/dL   Calcium 9.8  8.4 - 57.8 mg/dL   Total Protein 7.9  6.0 - 8.3 g/dL   Albumin 4.2  3.5 - 5.2 g/dL   AST 25  0 - 37 U/L   ALT 35  0 - 53 U/L   Alkaline Phosphatase 142 (*) 39 - 117 U/L   Total Bilirubin 0.2 (*) 0.3 - 1.2 mg/dL   GFR calc non Af Amer >90  >90 mL/min   GFR calc Af Amer >90  >90 mL/min   Comment:            The eGFR has been calculated     using the CKD EPI equation.     This calculation has not been     validated in all clinical     situations.     eGFR's persistently     <90 mL/min signify     possible Chronic Kidney Disease.  TSH     Status: None   Collection Time    12/11/12  7:42 PM      Result Value Range   TSH 0.945  0.350 - 4.500 uIU/mL  T4, FREE     Status: None   Collection Time    12/11/12  7:42 PM      Result Value Range   Free T4 0.94  0.80 - 1.80 ng/dL    Physical Findings: AIMS: Facial and Oral Movements Muscles of Facial Expression: None, normal Lips and Perioral Area: None, normal Jaw: None, normal Tongue: None, normal,Extremity Movements Upper (arms, wrists, hands, fingers): None, normal Lower (legs, knees, ankles, toes): None, normal, Trunk Movements Neck, shoulders, hips: None, normal,  Overall Severity Severity of abnormal movements (highest score from questions above): None, normal Incapacitation due to abnormal movements: None, normal Patient's awareness of abnormal movements (rate only patient's report): No Awareness, Dental Status Current problems with teeth and/or dentures?: No Does patient usually wear dentures?: No  CIWA:  CIWA-Ar Total: 0 COWS:     Treatment Plan Summary: Daily contact with patient to assess and evaluate symptoms and progress in treatment Medication management  Plan:1. Continue crisis management and stabilization. 2. Medication management to reduce current symptoms to base line and improve patient's overall level of functioning 3. Treat health problems  as indicated. 4. Develop treatment plan to decrease risk of relapse upon discharge and the need for     readmission. 5. Psycho-social education regarding relapse prevention and self care. 6. Health care follow up as needed for medical problems. 7. Continue home medications where appropriate. 8. Will D/C depakote. 9. Will add Buspar 7.5 po BID, 10. Will start Topamax 50mg  po BID with plans to increase over the next week. 11. Will discuss +UDS for THC and opiates in AM prior to D/C. 12. Will plan for discharge tomorrow if the patient is ready with plans to follow up with his psychiatrist. 13. Did discuss with Dr. Dellia Cloud that Debby Bud should not be placed on psychostimulants in the future and that Benzos should be kept off the list as well.  Medical Decision Making Problem Points:  Established problem, stable/improving (1), New problem, with additional work-up planned (4) and Review of psycho-social stressors (1) Data Points:  Discuss tests with performing physician (1) Review or order medicine tests (1) Review and summation of old records (2)  I certify that inpatient services furnished can reasonably be expected to improve the patient's condition.   Mohamud Mrozek 12/12/2012, 2:47 PM

## 2012-12-13 ENCOUNTER — Other Ambulatory Visit (HOSPITAL_COMMUNITY): Payer: BC Managed Care – PPO

## 2012-12-13 MED ORDER — LAMOTRIGINE 150 MG PO TABS: 300.0000 mg | ORAL_TABLET | Freq: Every day | ORAL | Status: DC

## 2012-12-13 MED ORDER — TRAZODONE HCL 50 MG PO TABS: 50.0000 mg | ORAL_TABLET | Freq: Every evening | ORAL | Status: DC | PRN

## 2012-12-13 MED ORDER — TOPIRAMATE 25 MG PO TABS: 25.0000 mg | ORAL_TABLET | Freq: Two times a day (BID) | ORAL | Status: DC

## 2012-12-13 MED ORDER — IBUPROFEN 200 MG PO TABS: 800.0000 mg | ORAL_TABLET | Freq: Four times a day (QID) | ORAL | Status: DC | PRN

## 2012-12-13 MED ORDER — BUSPIRONE HCL 7.5 MG PO TABS: 7.5000 mg | ORAL_TABLET | Freq: Two times a day (BID) | ORAL | Status: DC

## 2012-12-13 NOTE — [facility] Suicide Risk Assessment (Signed)
[facility] INPATIENT:  Family/Significant Other Suicide Prevention Education  Suicide Prevention Education:  Education Completed; No one has been identified by the patient as the family member/significant other with whom the patient will be residing, and identified as the person(s) who will aid the patient in the event of a mental health crisis (suicidal ideations/suicide attempt).  With written consent from the patient, the family member/significant other has been provided the following suicide prevention education, prior to the and/or following the discharge of the patient.  The suicide prevention education provided includes the following:  Suicide risk factors  Suicide prevention and interventions  National Suicide Hotline telephone number  [facility] assessment telephone number  [facility] Emergency Assistance 911  [facility] and/or Residential Mobile Crisis Unit telephone number  Request made of family/significant other to:  Remove weapons (e.g., guns, rifles, knives), all items previously/currently identified as safety concern.    Remove drugs/medications (over-the-counter, prescriptions, illicit drugs), all items previously/currently identified as a safety concern.  The family member/significant other verbalizes understanding of the suicide prevention education information provided.  The family member/significant other agrees to remove the items of safety concern listed above. Rosario did not complain of SI at admission, nor did he endorse SI while in the hospital.  No SPE required.  Daryel Gerald B 12/13/2012, 10:38 AM

## 2012-12-13 NOTE — Progress Notes (Signed)
[facility] Adult Case Management Discharge Plan :  Will you be returning to the same living situation after discharge: Yes, home with mother At discharge, do you have transportation home?:Yes,  mother Do you have the ability to pay for your medications:Yes,  [employer]  Release of information consent forms completed and in the chart;  Patient's signature needed at discharge.  Patient to Follow up at: Follow-up Information   Follow up with Dr Dellia Cloud On 12/23/2012. (11AM)    Contact information:   [facility]  [address]  [phone]      Follow up with Emerson Monte On 12/17/2012. (11AM)    Contact information:   [place]  [place]  [address] [phone]      Patient denies SI/HI:   Yes,  in group and tx team    Safety Planning and Suicide Prevention discussed:  Yes,  yes  Smart, Ledell Peoples 12/13/2012, 10:11 AM

## 2012-12-13 NOTE — Progress Notes (Signed)
Recreation Therapy Notes  Date: 04.11.2014 Time: 9:30am Location: [address] Day Room      Group Topic/Focus: Teamwork, Goal Setting, Memory  Participation Level: Active  Participation Quality: Appropriate  Affect: Euthymic  Cognitive: Oriented   Additional Comments: Activities: Stretching; "Palm a Bean Bag" & "Memory Sequence." Explain ation: Stretching - patients were asked to do a series of stretches. Patient were asked to stretch arms, back, and legs. Stretching took approximately 10 minutes. "Palm a Bean Bag" - Patients were asked to hold a bean bag in hand with palm facing up. Patients were asked to pass the bean bag to the peer to their left by first going over their head and behind their back. Patients were asked to set a goal for how fast they could complete the activity following each round. "Memory Sequence" - Patients were asked to create a sequence of actions for each member of the group to complete. The entire group had to complete the sequence before a new action as added on. Each patient got a turn to add to the sequence of actions. Patients participated from a seated position.     Patient entered group space as peers and LRT were completing a series of stretches. Patient activity participated in stretching exercises. Patient did not participate in "Palm a Bean Bag." Patient stated he did not want to participate because he has previously broken both of his shoulders. Patient chose to participate in "Memory Sequence." Patient with peers created the following sequence: Clap once, Clap twice, Pat top of head once, Snap, Pat tops of legs, Thumbs up, Shuffle feet back and forth and in a criss cross pattern on the floor, Place hands on knees and Salute. Patient did not need assistance to complete sequence. Patient could be seen completing the sequence with his peers for practice for his turn.    Patient interacted appropriately with peers and LRT.   Brandon Roberts,  LRT/CTRS  Jearl Klinefelter 12/13/2012 12:53 PM

## 2012-12-13 NOTE — [facility] Suicide Risk Assessment (Signed)
Suicide Risk Assessment  Discharge Assessment     Demographic Factors:  Male and Unemployed  Mental Status Per Nursing Assessment::   On Admission:  NA  Current Mental Status by Physician: patient denies suicide ideation, intent or plan  Loss Factors: Decrease in vocational status and Decline in physical health  Historical Factors: NA  Risk Reduction Factors:   Responsible for children under 23 years of age, Sense of responsibility to family, Living with another person, especially a relative, Positive social support and Positive therapeutic relationship  Continued Clinical Symptoms: resolving anxiety and depressive symptoms  Cognitive Features That Contribute To Risk:  Closed-mindedness Polarized thinking    Suicide Risk:  Minimal: No identifiable suicidal ideation.  Patients presenting with no risk factors but with morbid ruminations; may be classified as minimal risk based on the severity of the depressive symptoms  Discharge Diagnoses:   AXIS I:  Bipolar affective disorder. Generalized anxiety disorder  AXIS II:  Deferred AXIS III:   Past Medical History  Diagnosis Date  . Anxiety   . Depression   . Pseudoseizure   . Irritable bowel syndrome (IBS)   . ADD (attention deficit disorder)   . Syncopal episodes     being monitored for cardiac causation  . Bipolar disorder    AXIS IV:  other psychosocial or environmental problems and problems related to social environment AXIS V:  61-70 mild symptoms  Plan Of Care/Follow-up recommendations:  Activity:  as tolerated Diet:  healthy Tests:  routine blood test as needed Other:  patient to keep his after care appointment with his psychiatrist.  Is patient on multiple antipsychotic therapies at discharge:  No   Has Patient had three or more failed trials of antipsychotic monotherapy by history:  No  Recommended Plan for Multiple Antipsychotic Therapies: N/A  Annistyn Depass,MD 12/13/2012, 9:57 AM

## 2012-12-13 NOTE — Progress Notes (Signed)
Nrsg DC MD completed DC SRA and entered DC order.  Pt denies  SI, HI, and / or presence of audit, vis, tactile halluc. Pt given DC AVS by this Clinical research associate. It is reviewed and discussed with him , he states he understands and can and will comply with DC f/u plans. He is given prescriptions for his meds, sample meds from the pharmacy and all belongings previously locked up are returned to him by this Clinical research associate and he signed release per protocol. HE was then escorted to bldg entrance and DC'd per MD order.

## 2012-12-16 ENCOUNTER — Other Ambulatory Visit (HOSPITAL_COMMUNITY): Payer: BC Managed Care – PPO

## 2012-12-17 ENCOUNTER — Other Ambulatory Visit (HOSPITAL_COMMUNITY): Payer: BC Managed Care – PPO

## 2012-12-18 ENCOUNTER — Other Ambulatory Visit (HOSPITAL_COMMUNITY): Payer: BC Managed Care – PPO

## 2012-12-18 NOTE — Progress Notes (Signed)
Patient Discharge Instructions:  After Visit Summary (AVS):   Faxed to:  12/18/12 Psychiatric Admission Assessment Note:   Faxed to:  12/18/12 Suicide Risk Assessment - Discharge Assessment:   Faxed to:  12/18/12 Next Level Care Provider Has Access to the EMR, 02/15/13 Faxed/Sent to the Next Level Care provider:  12/18/12 Faxed to Emerson Monte @ [phone] Records provided to [facility] via [facility]/Epic access  Jerelene Redden, 12/18/2012, 3:35 PM

## 2012-12-22 NOTE — Discharge Summary (Signed)
Physician Discharge Summary Note  Patient:  Brandon Roberts is an 22 y.o., male MRN:  [id number] DOB:  01-12-1991 Patient phone:  [phone] (home)  Patient address:   [address] [place] [address],   Date of Admission:  12/10/2012 Date of Discharge: 12/13/2012  Reason for Admission:  psychosis  Discharge Diagnoses: Active Problems:   * No active hospital problems. * Discharge Diagnoses:  AXIS I: Bipolar affective disorder. Generalized anxiety disorder  AXIS II: Deferred  AXIS III:  Past Medical History   Diagnosis  Date   .  Anxiety    .  Depression    .  Pseudoseizure    .  Irritable bowel syndrome (IBS)    .  ADD (attention deficit disorder)    .  Syncopal episodes      being monitored for cardiac causation   .  Bipolar disorder     AXIS IV: other psychosocial or environmental problems and problems related to social environment  AXIS V: 61-70 mild symptoms Review of Systems  Constitutional: Negative.  Negative for fever, chills, weight loss, malaise/fatigue and diaphoresis.  HENT: Negative for congestion and sore throat.   Eyes: Negative for blurred vision, double vision and photophobia.  Respiratory: Negative for cough, shortness of breath and wheezing.   Cardiovascular: Negative for chest pain, palpitations and PND.  Gastrointestinal: Negative for heartburn, nausea, vomiting, abdominal pain, diarrhea and constipation.  Musculoskeletal: Negative for myalgias, joint pain and falls.  Neurological: Negative for dizziness, tingling, tremors, sensory change, speech change, focal weakness, seizures, loss of consciousness, weakness and headaches.  Endo/Heme/Allergies: Negative for polydipsia. Does not bruise/bleed easily.  Psychiatric/Behavioral: Negative for depression, suicidal ideas, hallucinations, memory loss and substance abuse. The patient is not nervous/anxious and does not have insomnia.    Level of Care:  OP  Hospital Course:  Athony was admitted as a walk-in after  he was advised to do so by his therapist and his mother. He has a history of Bipolar affective disorder. Prospero' had recently been increased on his prozac, Adderall, and was taking xanax 3-4 x a day for increased anxiety.  He denied substance abuse on admission but his UDS was positive for opiates and THC.  He reported feeling unable to sleep, had SI with no plans, pressured speech and thoughts of harming a man who had brought a gun to the playground where he takes his 20 year old daughter.       He was evaluated and felt to be hypomanic and was discontinued off of the Adderall, and xanax, and started on Buspar for his anxiety, and Topamax 25mg  to help with mood stabilization.  He was continued on the Lamictal at 150mg , and given Trazodone for insomnia.  Jeramy' did not show any signs of withdrawal and reported feeling better off of the medication.      Zaydan' participated in unit programming and reported feeling better for having done so. He stated that he felt well having gotten more sleep and was anxious to be discharged to see his daughter participate in a pageant on Friday afternoon.       Kyri' denied SI/HI and AVH. He was in full contact with reality and was discharged out with plans to follow up with his therapist.  His therapist had already been contacted regarding further psychological testing and stated that it was already on order to be scheduled. It was also recommended that Latavion' not be placed on any psycho stimulants due to his increased severe anxiety  and risk for mania, and that SSRIs should be avoided as well for the same reasons.    Due to the addictive nature of benzodiazepines, it was also recommended that they be avoided as well.  Kaspar' was cautioned to avoid marijuana due to the risk of increasing his anxiety as well.   Consults:  None  Significant Diagnostic Studies:  labs: UA/UDS, CMP, CBC diff  Discharge Vitals:   Blood pressure 140/84, pulse 108, temperature 98.1 F (36.7 C),  temperature source Oral, resp. rate 18, height 5' 5.75" (1.67 m), weight 92.987 kg (205 lb). Body mass index is 33.34 kg/(m^2). Lab Results:   No results found for this or any previous visit (from the past 72 hour(s)).  Physical Findings: AIMS: Facial and Oral Movements Muscles of Facial Expression: None, normal Lips and Perioral Area: None, normal Jaw: None, normal Tongue: None, normal,Extremity Movements Upper (arms, wrists, hands, fingers): None, normal Lower (legs, knees, ankles, toes): None, normal, Trunk Movements Neck, shoulders, hips: None, normal, Overall Severity Severity of abnormal movements (highest score from questions above): None, normal Incapacitation due to abnormal movements: None, normal Patient's awareness of abnormal movements (rate only patient's report): No Awareness, Dental Status Current problems with teeth and/or dentures?: No Does patient usually wear dentures?: No  CIWA:  CIWA-Ar Total: 0 COWS:     Psychiatric Specialty Exam: See Psychiatric Specialty Exam and Suicide Risk Assessment completed by Attending Physician prior to discharge.  Discharge destination:  Home  Is patient on multiple antipsychotic therapies at discharge:  No   Has Patient had three or more failed trials of antipsychotic monotherapy by history:  No  Recommended Plan for Multiple Antipsychotic Therapies: not applicable      Medication List    STOP taking these medications       ALPRAZolam 1 MG tablet  Commonly known as:  XANAX     FLUoxetine 20 MG tablet  Commonly known as:  PROZAC     HYDROcodone-acetaminophen 5-325 MG per tablet  Commonly known as:  NORCO/VICODIN     methylphenidate 10 MG tablet  Commonly known as:  RITALIN      TAKE these medications     Indication   busPIRone 7.5 MG tablet  Commonly known as:  BUSPAR  Take 1 tablet (7.5 mg total) by mouth 2 (two) times daily. For anxiety   Indication:  Generalized Anxiety Disorder     ibuprofen 200 MG  tablet  Commonly known as:  ADVIL,MOTRIN  Take 4 tablets (800 mg total) by mouth every 6 (six) hours as needed (For shoulder pain.).   Indication:  Mild to Moderate Pain, Joint Damage causing Pain and Loss of Function, Rheumatoid Arthritis     lamoTRIgine 150 MG tablet  Commonly known as:  LAMICTAL  Take 2 tablets (300 mg total) by mouth at bedtime. For mood stabilization   Indication:  Mood stabilization     topiramate 25 MG tablet  Commonly known as:  TOPAMAX  Take 1 tablet (25 mg total) by mouth 2 (two) times daily. For mood stabilization/seizure activities   Indication:  Partial Onset Seizures     traZODone 50 MG tablet  Commonly known as:  DESYREL  Take 1 tablet (50 mg total) by mouth at bedtime and may repeat dose one time if needed. For depression/sleep   Indication:  Trouble Sleeping, Major Depressive Disorder           Follow-up Information   Follow up with Dr Dellia Cloud On 12/23/2012. (11AM)  Contact information:   [facility]  [address]  [phone]      Follow up with Emerson Monte On 12/17/2012. (11AM)    Contact information:   [facility]  [place]  [address] [phone]      Follow-up recommendations:   Activities: Resume activity as tolerated. Diet: Heart healthy low sodium diet Tests: Follow up testing will be determined by your out patient provider. Comments:  Avoid marijuana, stimulants, SSRIs, Benzodiazepines. Keep all follow up appointments for psychological testing.  Total Discharge Time:  Greater than 30 minutes.  Signed: Osiris Charles 12/22/2012, 9:07 PM

## 2012-12-23 ENCOUNTER — Ambulatory Visit: Payer: BC Managed Care – PPO | Admitting: Psychology

## 2012-12-23 NOTE — Discharge Summary (Signed)
Seen and agreed. Halee Glynn, MD 

## 2013-01-13 ENCOUNTER — Ambulatory Visit (INDEPENDENT_AMBULATORY_CARE_PROVIDER_SITE_OTHER): Payer: BC Managed Care – PPO | Admitting: Psychology

## 2013-01-13 DIAGNOSIS — F331 Major depressive disorder, recurrent, moderate: Secondary | ICD-10-CM

## 2013-01-30 ENCOUNTER — Ambulatory Visit (INDEPENDENT_AMBULATORY_CARE_PROVIDER_SITE_OTHER): Payer: BC Managed Care – PPO | Admitting: Psychology

## 2013-01-30 DIAGNOSIS — F331 Major depressive disorder, recurrent, moderate: Secondary | ICD-10-CM

## 2013-02-13 ENCOUNTER — Ambulatory Visit (INDEPENDENT_AMBULATORY_CARE_PROVIDER_SITE_OTHER): Payer: BC Managed Care – PPO | Admitting: Psychology

## 2013-02-13 DIAGNOSIS — F331 Major depressive disorder, recurrent, moderate: Secondary | ICD-10-CM

## 2013-03-11 ENCOUNTER — Ambulatory Visit (INDEPENDENT_AMBULATORY_CARE_PROVIDER_SITE_OTHER): Payer: BC Managed Care – PPO | Admitting: Psychology

## 2013-03-11 DIAGNOSIS — F331 Major depressive disorder, recurrent, moderate: Secondary | ICD-10-CM

## 2013-03-18 ENCOUNTER — Ambulatory Visit: Payer: BC Managed Care – PPO | Admitting: Psychology

## 2013-05-09 ENCOUNTER — Ambulatory Visit (INDEPENDENT_AMBULATORY_CARE_PROVIDER_SITE_OTHER): Payer: BC Managed Care – PPO | Admitting: Psychology

## 2013-05-09 DIAGNOSIS — F331 Major depressive disorder, recurrent, moderate: Secondary | ICD-10-CM

## 2013-05-28 ENCOUNTER — Ambulatory Visit: Payer: BC Managed Care – PPO | Admitting: Psychology

## 2013-06-27 ENCOUNTER — Ambulatory Visit: Payer: BC Managed Care – PPO | Admitting: Psychology

## 2013-07-07 ENCOUNTER — Ambulatory Visit (INDEPENDENT_AMBULATORY_CARE_PROVIDER_SITE_OTHER): Payer: BC Managed Care – PPO | Admitting: Psychology

## 2013-07-07 ENCOUNTER — Ambulatory Visit: Payer: BC Managed Care – PPO | Admitting: Psychology

## 2013-07-07 DIAGNOSIS — F331 Major depressive disorder, recurrent, moderate: Secondary | ICD-10-CM

## 2013-07-12 ENCOUNTER — Encounter (HOSPITAL_COMMUNITY): Payer: Self-pay | Admitting: Emergency Medicine

## 2013-07-12 ENCOUNTER — Emergency Department (HOSPITAL_COMMUNITY)
Admission: EM | Admit: 2013-07-12 | Discharge: 2013-07-12 | Discharge status: Against Medical Advice | Payer: BC Managed Care – PPO | Attending: Emergency Medicine | Admitting: Emergency Medicine

## 2013-07-12 DIAGNOSIS — J029 Acute pharyngitis, unspecified: Secondary | ICD-10-CM | POA: Insufficient documentation

## 2013-07-12 DIAGNOSIS — R52 Pain, unspecified: Secondary | ICD-10-CM | POA: Insufficient documentation

## 2013-07-12 DIAGNOSIS — R0989 Other specified symptoms and signs involving the circulatory and respiratory systems: Secondary | ICD-10-CM | POA: Insufficient documentation

## 2013-07-12 DIAGNOSIS — R0609 Other forms of dyspnea: Secondary | ICD-10-CM | POA: Insufficient documentation

## 2013-07-12 DIAGNOSIS — R197 Diarrhea, unspecified: Secondary | ICD-10-CM | POA: Insufficient documentation

## 2013-07-12 NOTE — ED Notes (Signed)
Pt states body aches, headache , sore throat, stuffy nose, and Diarrhea. Pt also states it hurts to take a deep breath. NAD. Symptoms began Sunday.

## 2013-07-21 ENCOUNTER — Ambulatory Visit (INDEPENDENT_AMBULATORY_CARE_PROVIDER_SITE_OTHER): Payer: BC Managed Care – PPO | Admitting: Psychology

## 2013-07-21 DIAGNOSIS — F331 Major depressive disorder, recurrent, moderate: Secondary | ICD-10-CM

## 2013-08-04 ENCOUNTER — Ambulatory Visit: Payer: BC Managed Care – PPO | Admitting: Psychology

## 2013-08-18 ENCOUNTER — Ambulatory Visit (INDEPENDENT_AMBULATORY_CARE_PROVIDER_SITE_OTHER): Payer: BC Managed Care – PPO | Admitting: Psychology

## 2013-08-18 DIAGNOSIS — F331 Major depressive disorder, recurrent, moderate: Secondary | ICD-10-CM

## 2013-09-08 ENCOUNTER — Ambulatory Visit (INDEPENDENT_AMBULATORY_CARE_PROVIDER_SITE_OTHER): Payer: BC Managed Care – PPO | Admitting: Psychology

## 2013-09-08 DIAGNOSIS — F331 Major depressive disorder, recurrent, moderate: Secondary | ICD-10-CM

## 2013-09-22 ENCOUNTER — Ambulatory Visit: Payer: BC Managed Care – PPO | Admitting: Psychology

## 2013-10-06 ENCOUNTER — Ambulatory Visit (INDEPENDENT_AMBULATORY_CARE_PROVIDER_SITE_OTHER): Payer: BC Managed Care – PPO | Admitting: Psychology

## 2013-10-06 DIAGNOSIS — F331 Major depressive disorder, recurrent, moderate: Secondary | ICD-10-CM

## 2013-10-20 ENCOUNTER — Ambulatory Visit: Payer: BC Managed Care – PPO | Admitting: Psychology

## 2013-10-27 ENCOUNTER — Ambulatory Visit: Payer: BC Managed Care – PPO | Admitting: Psychology

## 2014-03-10 ENCOUNTER — Ambulatory Visit (INDEPENDENT_AMBULATORY_CARE_PROVIDER_SITE_OTHER): Payer: BC Managed Care – PPO | Admitting: Psychology

## 2014-03-10 DIAGNOSIS — F331 Major depressive disorder, recurrent, moderate: Secondary | ICD-10-CM

## 2014-03-24 ENCOUNTER — Ambulatory Visit (INDEPENDENT_AMBULATORY_CARE_PROVIDER_SITE_OTHER): Payer: BC Managed Care – PPO | Admitting: Psychology

## 2014-03-24 DIAGNOSIS — F331 Major depressive disorder, recurrent, moderate: Secondary | ICD-10-CM

## 2014-03-26 ENCOUNTER — Ambulatory Visit: Payer: BC Managed Care – PPO | Admitting: Psychology

## 2014-04-09 ENCOUNTER — Ambulatory Visit (INDEPENDENT_AMBULATORY_CARE_PROVIDER_SITE_OTHER): Payer: BC Managed Care – PPO | Admitting: Psychology

## 2014-04-09 DIAGNOSIS — F331 Major depressive disorder, recurrent, moderate: Secondary | ICD-10-CM

## 2014-04-21 ENCOUNTER — Ambulatory Visit (INDEPENDENT_AMBULATORY_CARE_PROVIDER_SITE_OTHER): Payer: BC Managed Care – PPO | Admitting: Psychology

## 2014-04-21 DIAGNOSIS — F331 Major depressive disorder, recurrent, moderate: Secondary | ICD-10-CM

## 2014-05-05 ENCOUNTER — Ambulatory Visit: Payer: BC Managed Care – PPO | Admitting: Psychology

## 2014-10-22 ENCOUNTER — Ambulatory Visit: Payer: Self-pay | Admitting: Psychology

## 2015-07-24 ENCOUNTER — Encounter (HOSPITAL_COMMUNITY): Payer: Self-pay | Admitting: Emergency Medicine

## 2015-07-24 ENCOUNTER — Emergency Department (HOSPITAL_COMMUNITY)
Admission: EM | Admit: 2015-07-24 | Discharge: 2015-07-24 | Disposition: A | Discharge status: Home / Self Care | Payer: 59 | Attending: Emergency Medicine | Admitting: Emergency Medicine

## 2015-07-24 DIAGNOSIS — Z8719 Personal history of other diseases of the digestive system: Secondary | ICD-10-CM | POA: Diagnosis not present

## 2015-07-24 DIAGNOSIS — F319 Bipolar disorder, unspecified: Secondary | ICD-10-CM | POA: Insufficient documentation

## 2015-07-24 DIAGNOSIS — Z79899 Other long term (current) drug therapy: Secondary | ICD-10-CM | POA: Insufficient documentation

## 2015-07-24 DIAGNOSIS — J0110 Acute frontal sinusitis, unspecified: Secondary | ICD-10-CM | POA: Insufficient documentation

## 2015-07-24 DIAGNOSIS — F419 Anxiety disorder, unspecified: Secondary | ICD-10-CM | POA: Insufficient documentation

## 2015-07-24 DIAGNOSIS — R52 Pain, unspecified: Secondary | ICD-10-CM | POA: Diagnosis present

## 2015-07-24 MED ORDER — AMOXICILLIN-POT CLAVULANATE 875-125 MG PO TABS: 1.0000 | ORAL_TABLET | Freq: Two times a day (BID) | ORAL | Status: DC

## 2015-07-24 MED ORDER — AMOXICILLIN-POT CLAVULANATE 875-125 MG PO TABS
ORAL_TABLET | ORAL | Status: AC
  Filled 2015-07-24: qty 1

## 2015-07-24 MED ORDER — AMOXICILLIN-POT CLAVULANATE 875-125 MG PO TABS
1.0000 | ORAL_TABLET | Freq: Once | ORAL | Status: AC
  Administered 2015-07-24: 1 via ORAL

## 2015-07-24 NOTE — ED Provider Notes (Signed)
CSN: [id number]     Arrival date & time 07/24/15  1840 History   First MD Initiated Contact with Patient 07/24/15 1855     Chief Complaint  Patient presents with  . Generalized Body Aches     (Consider location/radiation/quality/duration/timing/severity/associated sxs/prior Treatment) Patient is a 24 y.o. male presenting with general illness.  Illness Location:  Sinuses Severity:  Mild Onset quality:  Gradual Timing:  Constant Chronicity:  New Associated symptoms: congestion, cough, fever, headaches, rhinorrhea and sore throat   Associated symptoms: no abdominal pain, no chest pain, no nausea, no vomiting and no wheezing     Past Medical History  Diagnosis Date  . Anxiety   . Depression   . Pseudoseizure (HCC)   . Irritable bowel syndrome (IBS)   . ADD (attention deficit disorder)   . Syncopal episodes     being monitored for cardiac causation  . Bipolar disorder [facility](HCC)    Past Surgical History  Procedure Laterality Date  . Shoulder surgery    . Heart monitor implant     Family History  Problem Relation Age of Onset  . Hypertension Mother   . Migraines Mother   . Drug abuse Mother   . Drug abuse Father   . Drug abuse Paternal Grandfather    Social History  Substance Use Topics  . Smoking status: Never Smoker   . Smokeless tobacco: Never Used  . Alcohol Use: No    Review of Systems  Constitutional: Positive for fever and chills.  HENT: Positive for congestion, rhinorrhea and sore throat.   Eyes: Negative for pain and redness.  Respiratory: Positive for cough. Negative for wheezing.   Cardiovascular: Negative for chest pain.  Gastrointestinal: Negative for nausea, vomiting and abdominal pain.  Endocrine: Negative for polydipsia and polyuria.  Genitourinary: Negative for frequency and enuresis.  Neurological: Positive for headaches.  All other systems reviewed and are negative.     Allergies  Review of patient's allergies indicates no known  allergies.  Home Medications   Prior to Admission medications   Medication Sig Start Date End Date Taking? Authorizing Provider  amoxicillin-clavulanate (AUGMENTIN) 875-125 MG tablet Take 1 tablet by mouth 2 (two) times daily. One po bid x 7 days 07/24/15   Marily MemosJason Maymuna Detzel, MD  busPIRone (BUSPAR) 7.5 MG tablet Take 1 tablet (7.5 mg total) by mouth 2 (two) times daily. For anxiety 12/13/12   Sanjuana KavaAgnes I Nwoko, NP  ibuprofen (ADVIL,MOTRIN) 200 MG tablet Take 4 tablets (800 mg total) by mouth every 6 (six) hours as needed (For shoulder pain.). 12/13/12   Sanjuana KavaAgnes I Nwoko, NP  lamoTRIgine (LAMICTAL) 150 MG tablet Take 2 tablets (300 mg total) by mouth at bedtime. For mood stabilization 12/13/12   Sanjuana KavaAgnes I Nwoko, NP  topiramate (TOPAMAX) 25 MG tablet Take 1 tablet (25 mg total) by mouth 2 (two) times daily. For mood stabilization/seizure activities 12/13/12   Sanjuana KavaAgnes I Nwoko, NP  traZODone (DESYREL) 50 MG tablet Take 1 tablet (50 mg total) by mouth at bedtime and may repeat dose one time if needed. For depression/sleep 12/13/12   Sanjuana KavaAgnes I Nwoko, NP   BP 133/73 mmHg  Pulse 92  Temp(Src) 98.9 F (37.2 C) (Oral)  Resp 16  Ht 5\' 6"  (1.676 m)  Wt 195 lb (88.451 kg)  BMI 31.49 kg/m2  SpO2 99% Physical Exam  Constitutional: He appears well-developed and well-nourished.  HENT:  Head: Normocephalic and atraumatic.  Nose: Mucosal edema and rhinorrhea present. No epistaxis. Left sinus exhibits frontal sinus  tenderness.  Mouth/Throat: Posterior oropharyngeal erythema present. No posterior oropharyngeal edema or tonsillar abscesses.  Neck: Normal range of motion.  Cardiovascular: Normal rate.   Pulmonary/Chest: Effort normal. No respiratory distress.  Abdominal: He exhibits no distension.  Musculoskeletal: Normal range of motion.  Neurological: He is alert.  Nursing note and vitals reviewed.   ED Course  Procedures (including critical care time) Labs Review Labs Reviewed - No data to display  Imaging Review No  results found. I have personally reviewed and evaluated these images and lab results as part of my medical decision-making.   EKG Interpretation None      MDM   Final diagnoses:  Acute frontal sinusitis, recurrence not specified   24 yo M w/ 2 weeks of headache, rhinorrhea, nose pain. Blood in mucous, cough, fever and chills. Exam as above. Likely acute sinusitis. With 2 weeks of symptoms, will tx w/ abx. No e/o severe complications.      Marily Memos, MD 07/24/15 [phone]

## 2015-07-24 NOTE — ED Notes (Signed)
Pt reports on-going headache and general malaise x two weeks. Pt also reports diarrhea, with two episodes this morning. Pt hx of IBS. Denies n/v.

## 2016-03-15 ENCOUNTER — Encounter (HOSPITAL_COMMUNITY): Payer: Self-pay | Admitting: Emergency Medicine

## 2016-03-15 ENCOUNTER — Emergency Department (HOSPITAL_COMMUNITY): Payer: 59

## 2016-03-15 ENCOUNTER — Emergency Department (HOSPITAL_COMMUNITY)
Admission: EM | Admit: 2016-03-15 | Discharge: 2016-03-15 | Disposition: A | Discharge status: Home / Self Care | Payer: 59 | Attending: Emergency Medicine | Admitting: Emergency Medicine

## 2016-03-15 DIAGNOSIS — W19XXXA Unspecified fall, initial encounter: Secondary | ICD-10-CM | POA: Diagnosis not present

## 2016-03-15 DIAGNOSIS — Z791 Long term (current) use of non-steroidal anti-inflammatories (NSAID): Secondary | ICD-10-CM | POA: Diagnosis not present

## 2016-03-15 DIAGNOSIS — Z792 Long term (current) use of antibiotics: Secondary | ICD-10-CM | POA: Insufficient documentation

## 2016-03-15 DIAGNOSIS — S63501A Unspecified sprain of right wrist, initial encounter: Secondary | ICD-10-CM

## 2016-03-15 DIAGNOSIS — M791 Myalgia: Secondary | ICD-10-CM | POA: Diagnosis not present

## 2016-03-15 DIAGNOSIS — S6991XA Unspecified injury of right wrist, hand and finger(s), initial encounter: Secondary | ICD-10-CM | POA: Diagnosis present

## 2016-03-15 DIAGNOSIS — Y939 Activity, unspecified: Secondary | ICD-10-CM | POA: Insufficient documentation

## 2016-03-15 DIAGNOSIS — F329 Major depressive disorder, single episode, unspecified: Secondary | ICD-10-CM | POA: Diagnosis not present

## 2016-03-15 DIAGNOSIS — Y929 Unspecified place or not applicable: Secondary | ICD-10-CM | POA: Insufficient documentation

## 2016-03-15 DIAGNOSIS — Y999 Unspecified external cause status: Secondary | ICD-10-CM | POA: Diagnosis not present

## 2016-03-15 NOTE — Discharge Instructions (Signed)
Generic Wrist Exercises RANGE OF MOTION (ROM) AND STRETCHING EXERCISES - Wrist Sprain  These exercises may help you when beginning to rehabilitate your injury. Your symptoms may resolve with or without further involvement from your physician, physical therapist, or athletic trainer. While completing these exercises, remember:   Restoring tissue flexibility helps normal motion to return to the joints. This allows healthier, less painful movement and activity.  An effective stretch should be held for at least 30 seconds.  A stretch should never be painful. You should only feel a gentle lengthening or release in the stretched tissue. RANGE OF MOTION - Wrist Flexion, Active-Assisted  Extend your right / left elbow with your palm pointing down.*  Gently pull the back of your hand toward you until you feel a gentle stretch on the top of your forearm.  Hold this position for __________ seconds. Repeat __________ times. Complete this exercise __________ times per day.  *If directed by your physician, physical therapist, or athletic trainer, complete this stretch with your elbow bent rather than extended. RANGE OF MOTION - Wrist Extension, Active-Assisted   Extend your right / left elbow and turn your palm upward.*  Gently pull your palm/fingertips back so your wrist extends and your fingers point more toward the ground.  You should feel a gentle stretch on the inside of your forearm.  Hold this position for __________ seconds. Repeat __________ times. Complete this exercise __________ times per day. *If directed by your physician, physical therapist, or athletic trainer, complete this stretch with your elbow bent, rather than extended. RANGE OF MOTION - Supination, Active   Stand or sit with your elbows at your side. Bend your right / left elbow to 90 degrees.  Turn your palm upward until you feel a gentle stretch on the inside of your forearm.  Hold this position for __________ seconds.  Slowly release and return to the starting position. Repeat __________ times. Complete this stretch __________ times per day.  RANGE OF MOTION - Pronation, Active   Stand or sit with your elbows at your side. Bend your right / left elbow to 90 degrees.  Turn your palm downward until you feel a gentle stretch on the top of your forearm.  Hold this position for __________ seconds. Slowly release and return to the starting position. Repeat __________ times. Complete this stretch __________ times per day.  STRENGTHENING EXERCISES  These exercises may help you when beginning to rehabilitate your injury. They may resolve your symptoms with or without further involvement from your physician, physical therapist, or athletic trainer. While completing these exercises, remember:   Muscles can gain both the endurance and the strength needed for everyday activities through controlled exercises.  Complete these exercises as instructed by your physician, physical therapist, or athletic trainer. Progress the resistance and repetitions only as guided.  You may experience muscle soreness or fatigue, but the pain or discomfort you are trying to eliminate should never worsen during these exercises. If this pain does worsen, stop and make certain you are following the directions exactly. If the pain is still present after adjustments, discontinue the exercise until you can discuss the trouble with your clinician. STRENGTH - Wrist Flexors  Sit with your right / left forearm palm-up and fully supported. Your elbow should be resting below the height of your shoulder. Allow your wrist to extend over the edge of the surface.  Loosely holding a __________ weight or a piece of rubber exercise band/tubing, slowly curl your hand up toward your  forearm.  Hold this position for __________ seconds. Slowly lower the wrist back to the starting position in a controlled manner. Repeat __________ times. Complete this exercise  __________ times per day.  STRENGTH - Wrist Extensors  Sit with your right / left forearm palm-down and fully supported. Your elbow should be resting below the height of your shoulder. Allow your wrist to extend over the edge of the surface.  Loosely holding a __________ weight or a piece of rubber exercise band/tubing, slowly curl your hand up toward your forearm.  Hold this position for __________ seconds. Slowly lower the wrist back to the starting position in a controlled manner. Repeat __________ times. Complete this exercise __________ times per day.  STRENGTH - Forearm Supinators  Sit with your right / left forearm supported on a table, keeping your elbow below shoulder height. Rest your hand over the edge, palm down.  Gently grip a hammer or a soup ladle.  Without moving your elbow, slowly turn your palm and hand upward to a "thumbs-up" position.  Hold this position for __________ seconds. Slowly return to the starting position. Repeat __________ times. Complete this exercise __________ times per day.  STRENGTH - Forearm Pronators   Sit with your right / left forearm supported on a table, keeping your elbow below shoulder height. Rest your hand over the edge, palm up.  Gently grip a hammer or a soup ladle.  Without moving your elbow, slowly turn your palm and hand upward to a "thumbs-up" position.  Hold this position for __________ seconds. Slowly return to the starting position. Repeat __________ times. Complete this exercise __________ times per day.  STRENGTH - Grip  Grasp a tennis ball, a dense sponge, or a large, rolled sock in your hand.  Squeeze as hard as you can without increasing any pain.  Hold this position for __________ seconds. Release your grip slowly. Repeat __________ times. Complete this exercise __________ times per day.    This information is not intended to replace advice given to you by your health care provider. Make sure you discuss any questions  you have with your health care provider.   Document Released: 07/05/2005 Document Revised: 09/11/2014 Document Reviewed: 12/03/2008 Elsevier Interactive Patient Education 2016 [employer] Wrist Pain There are many things that can cause wrist pain. Some common causes include:  An injury to the wrist area, such as a sprain, strain, or fracture.  Overuse of the joint.  A condition that causes increased pressure on a nerve in the wrist (carpal tunnel syndrome).  Wear and tear of the joints that occurs with aging (osteoarthritis).  A variety of other types of arthritis. Sometimes, the cause of wrist pain is not known. The pain often goes away when you follow your health care provider's instructions for relieving pain at home. If your wrist pain continues, tests may need to be done to diagnose your condition. HOME CARE INSTRUCTIONS Pay attention to any changes in your symptoms. Take these actions to help with your pain:  Rest the wrist area for at least 48 hours or as told by your health care provider.  If directed, apply ice to the injured area:  Put ice in a plastic bag.  Place a towel between your skin and the bag.  Leave the ice on for 20 minutes, 2-3 times per day.  Keep your arm raised (elevated) above the level of your heart while you are sitting or lying down.  If a splint or elastic bandage has been applied,  use it as told by your health care provider.  Remove the splint or bandage only as told by your health care provider.  Loosen the splint or bandage if your fingers become numb or have a tingling feeling, or if they turn cold or blue.  Take over-the-counter and prescription medicines only as told by your health care provider.  Keep all follow-up visits as told by your health care provider. This is important. SEEK MEDICAL CARE IF:  Your pain is not helped by treatment.  Your pain gets worse. SEEK IMMEDIATE MEDICAL CARE IF:  Your fingers become swollen.  Your  fingers turn white, very red, or cold and blue.  Your fingers are numb or have a tingling feeling.  You have difficulty moving your fingers.   This information is not intended to replace advice given to you by your health care provider. Make sure you discuss any questions you have with your health care provider.   Document Released: 05/31/2005 Document Revised: 05/12/2015 Document Reviewed: 01/06/2015 Elsevier Interactive Patient Education [employer]

## 2016-03-15 NOTE — ED Notes (Addendum)
Patient states he fell and landed on his right wrist 1 month ago. Denies pain at triage, states "it only hurts when I use it or move it."

## 2016-03-15 NOTE — ED Provider Notes (Signed)
CSN: [id number]     Arrival date & time 03/15/16  1112 History   First MD Initiated Contact with Patient 03/15/16 1117     Chief Complaint  Patient presents with  . Wrist Injury     (Consider location/radiation/quality/duration/timing/severity/associated sxs/prior Treatment) Patient is a 25 y.o. male presenting with wrist pain. The history is provided by the patient. No language interpreter was used.  Wrist Pain This is a new problem. The current episode started today. The problem has been unchanged. Associated symptoms include joint swelling and myalgias. Nothing aggravates the symptoms. The treatment provided no relief.  Pt fell on wrist 4 weeks ago.  Pt reports pain since.  Pt has pain with using  Past Medical History  Diagnosis Date  . Anxiety   . Depression   . Pseudoseizure (HCC)   . Irritable bowel syndrome (IBS)   . ADD (attention deficit disorder)   . Syncopal episodes     being monitored for cardiac causation  . Bipolar disorder [facility])    Past Surgical History  Procedure Laterality Date  . Shoulder surgery    . Heart monitor implant     Family History  Problem Relation Age of Onset  . Hypertension Mother   . Migraines Mother   . Drug abuse Mother   . Drug abuse Father   . Drug abuse Paternal Grandfather    Social History  Substance Use Topics  . Smoking status: Never Smoker   . Smokeless tobacco: Never Used  . Alcohol Use: Yes     Comment: rarely    Review of Systems  Musculoskeletal: Positive for myalgias and joint swelling.  All other systems reviewed and are negative.     Allergies  Review of patient's allergies indicates no known allergies.  Home Medications   Prior to Admission medications   Medication Sig Start Date End Date Taking? Authorizing Provider  amoxicillin-clavulanate (AUGMENTIN) 875-125 MG tablet Take 1 tablet by mouth 2 (two) times daily. One po bid x 7 days 07/24/15   Marily Memos, MD  busPIRone (BUSPAR) 7.5 MG tablet Take 1  tablet (7.5 mg total) by mouth 2 (two) times daily. For anxiety 12/13/12   Sanjuana Kava, NP  ibuprofen (ADVIL,MOTRIN) 200 MG tablet Take 4 tablets (800 mg total) by mouth every 6 (six) hours as needed (For shoulder pain.). 12/13/12   Sanjuana Kava, NP  lamoTRIgine (LAMICTAL) 150 MG tablet Take 2 tablets (300 mg total) by mouth at bedtime. For mood stabilization 12/13/12   Sanjuana Kava, NP  topiramate (TOPAMAX) 25 MG tablet Take 1 tablet (25 mg total) by mouth 2 (two) times daily. For mood stabilization/seizure activities 12/13/12   Sanjuana Kava, NP  traZODone (DESYREL) 50 MG tablet Take 1 tablet (50 mg total) by mouth at bedtime and may repeat dose one time if needed. For depression/sleep 12/13/12   Sanjuana Kava, NP   BP 128/71 mmHg  Pulse 61  Temp(Src) 98 F (36.7 C) (Oral)  Resp 16  Ht  (1.651 m)  Wt 86.183 kg  BMI 31.62 kg/m2  SpO2 99% Physical Exam  Constitutional: He appears well-developed and well-nourished.  HENT:  Head: Normocephalic.  Musculoskeletal: He exhibits tenderness.  Tender right wrist,  From  nv and ns intact,    Neurological: He is alert.  Psychiatric: He has a normal mood and affect.  Vitals reviewed.   ED Course  Procedures (including critical care time) Labs Review Labs Reviewed - No data to display  Imaging Review Dg Wrist Complete Right  03/15/2016  CLINICAL DATA:  Trip and fall 1 month ago with persistent wrist pain, initial encounter EXAM: RIGHT WRIST - COMPLETE 3+ VIEW COMPARISON:  None. FINDINGS: There is no evidence of fracture or dislocation. There is no evidence of arthropathy or other focal bone abnormality. Soft tissues are unremarkable. IMPRESSION: No acute abnormality noted. Electronically Signed   By: Alcide CleverMark  Lukens M.D.   On: 03/15/2016 11:52   I have personally reviewed and evaluated these images and lab results as part of my medical decision-making.   EKG Interpretation None      MDM   Final diagnoses:  Wrist sprain, right,  initial encounter    An After Visit Summary was printed and given to the patient. Wrist splint   Elson AreasLeslie K Chevette Fee, PA-C 03/15/16 [id number]  Athene Schuhmacher K BrodheadSofia, PA-C 03/15/16 1308  Jacalyn LefevreJulie Haviland, MD 03/15/16 [phone]

## 2016-08-25 ENCOUNTER — Emergency Department (HOSPITAL_COMMUNITY)
Admission: EM | Admit: 2016-08-25 | Discharge: 2016-08-25 | Disposition: A | Discharge status: Home / Self Care | Payer: BLUE CROSS/BLUE SHIELD | Attending: Emergency Medicine | Admitting: Emergency Medicine

## 2016-08-25 ENCOUNTER — Encounter (HOSPITAL_COMMUNITY): Payer: Self-pay | Admitting: Emergency Medicine

## 2016-08-25 ENCOUNTER — Emergency Department (HOSPITAL_COMMUNITY): Payer: BLUE CROSS/BLUE SHIELD

## 2016-08-25 DIAGNOSIS — J029 Acute pharyngitis, unspecified: Secondary | ICD-10-CM | POA: Diagnosis not present

## 2016-08-25 DIAGNOSIS — J209 Acute bronchitis, unspecified: Secondary | ICD-10-CM | POA: Insufficient documentation

## 2016-08-25 DIAGNOSIS — R197 Diarrhea, unspecified: Secondary | ICD-10-CM | POA: Diagnosis not present

## 2016-08-25 DIAGNOSIS — F988 Other specified behavioral and emotional disorders with onset usually occurring in childhood and adolescence: Secondary | ICD-10-CM | POA: Insufficient documentation

## 2016-08-25 LAB — RAPID STREP SCREEN (MED CTR MEBANE ONLY): STREPTOCOCCUS, GROUP A SCREEN (DIRECT): NEGATIVE

## 2016-08-25 MED ORDER — BENZONATATE 100 MG PO CAPS
200.0000 mg | ORAL_CAPSULE | Freq: Once | ORAL | Status: AC
  Administered 2016-08-25: 200 mg via ORAL
  Filled 2016-08-25: qty 2

## 2016-08-25 MED ORDER — ONDANSETRON 8 MG PO TBDP
8.0000 mg | ORAL_TABLET | Freq: Once | ORAL | Status: AC
  Administered 2016-08-25: 8 mg via ORAL
  Filled 2016-08-25: qty 1

## 2016-08-25 MED ORDER — ONDANSETRON HCL 4 MG/2ML IJ SOLN
4.0000 mg | Freq: Once | INTRAMUSCULAR | Status: DC
  Filled 2016-08-25: qty 2

## 2016-08-25 MED ORDER — IBUPROFEN 800 MG PO TABS
800.0000 mg | ORAL_TABLET | Freq: Once | ORAL | Status: AC
  Administered 2016-08-25: 800 mg via ORAL
  Filled 2016-08-25: qty 1

## 2016-08-25 MED ORDER — IBUPROFEN 600 MG PO TABS: 600.0000 mg | ORAL_TABLET | Freq: Four times a day (QID) | ORAL | 0 refills | Status: DC | PRN

## 2016-08-25 MED ORDER — AZITHROMYCIN 250 MG PO TABS: ORAL_TABLET | ORAL | 0 refills | Status: DC

## 2016-08-25 MED ORDER — BENZONATATE 100 MG PO CAPS: 200.0000 mg | ORAL_CAPSULE | Freq: Three times a day (TID) | ORAL | 0 refills | Status: DC | PRN

## 2016-08-25 NOTE — ED Provider Notes (Signed)
[facility]-EMERGENCY DEPT Provider Note   CSN: [id number] Arrival date & time: 08/25/16  4098     History   Chief Complaint Chief Complaint  Patient presents with  . Sore Throat    HPI Brandon Roberts is a 25 y.o. male presenting with a one week history of flu like symptoms, describing generalized body aches, cough which has been nonproductive, sore throat, subjective fevers and chills and new onset of diarrhea which started 2 days ago. He reports loose stools, nonbloody 3 times yesterday, once this am.  He denies nausea, vomiting and abdominal pain.  He does endorse feeling sob, states it feels hard to take a deep breath and he has midsternal burning pain with cough only.  He has taken a whole bottle of theraflu, last dose last night without relief.  He has had no other medicines prior to arrival.   The history is provided by the patient.    Past Medical History:  Diagnosis Date  . ADD (attention deficit disorder)   . Anxiety   . Bipolar disorder (HCC)   . Depression   . Irritable bowel syndrome (IBS)   . Pseudoseizure   . Syncopal episodes    being monitored for cardiac causation    Patient Active Problem List   Diagnosis Date Noted  . Bipolar affective disorder (HCC) 01/17/2012    Class: Acute  . SPASM OF MUSCLE 08/17/2010  . ANXIETY 01/10/2010  . DEPRESSION 01/10/2010  . ALLERGIC RHINITIS 01/10/2010  . SHOULDER PAIN, BILATERAL 01/10/2010  . INSOMNIA-SLEEP DISORDER-UNSPEC 01/10/2010  . SYNCOPE 04/26/2009    Past Surgical History:  Procedure Laterality Date  . heart monitor implant    . SHOULDER SURGERY         Home Medications    Prior to Admission medications   Medication Sig Start Date End Date Taking? Authorizing Provider  amoxicillin-clavulanate (AUGMENTIN) 875-125 MG tablet Take 1 tablet by mouth 2 (two) times daily. One po bid x 7 days 07/24/15   Marily Memos, MD  azithromycin (ZITHROMAX Z-PAK) 250 MG tablet Take 2 tablets by mouth on day one followed by  one tablet daily for 4 days. 08/25/16   Burgess Amor, PA-C  benzonatate (TESSALON) 100 MG capsule Take 2 capsules (200 mg total) by mouth 3 (three) times daily as needed. 08/25/16   Burgess Amor, PA-C  busPIRone (BUSPAR) 7.5 MG tablet Take 1 tablet (7.5 mg total) by mouth 2 (two) times daily. For anxiety 12/13/12   Sanjuana Kava, NP  ibuprofen (ADVIL,MOTRIN) 600 MG tablet Take 1 tablet (600 mg total) by mouth every 6 (six) hours as needed. 08/25/16   Burgess Amor, PA-C  lamoTRIgine (LAMICTAL) 150 MG tablet Take 2 tablets (300 mg total) by mouth at bedtime. For mood stabilization 12/13/12   Sanjuana Kava, NP  topiramate (TOPAMAX) 25 MG tablet Take 1 tablet (25 mg total) by mouth 2 (two) times daily. For mood stabilization/seizure activities 12/13/12   Sanjuana Kava, NP  traZODone (DESYREL) 50 MG tablet Take 1 tablet (50 mg total) by mouth at bedtime and may repeat dose one time if needed. For depression/sleep 12/13/12   Sanjuana Kava, NP    Family History Family History  Problem Relation Age of Onset  . Hypertension Mother   . Migraines Mother   . Drug abuse Mother   . Drug abuse Father   . Drug abuse Paternal Grandfather     Social History Social History  Substance Use Topics  . Smoking status: Never  Smoker  . Smokeless tobacco: Never Used  . Alcohol use Yes     Comment: rarely     Allergies   Patient has no known allergies.   Review of Systems Review of Systems  Constitutional: Positive for chills and fever.  HENT: Positive for sore throat. Negative for congestion, ear pain, rhinorrhea, sinus pressure, trouble swallowing and voice change.   Eyes: Negative for discharge.  Respiratory: Positive for cough and chest tightness. Negative for shortness of breath, wheezing and stridor.   Cardiovascular: Negative for chest pain.  Gastrointestinal: Positive for diarrhea. Negative for abdominal pain, nausea and vomiting.  Genitourinary: Negative.   Musculoskeletal: Positive for myalgias.      Physical Exam Updated Vital Signs BP 117/70 (BP Location: Left Arm)   Pulse 78   Temp 98.7 F (37.1 C) (Oral)   Resp 16   Ht 5\' 6"  (1.676 m)   Wt 86.6 kg   SpO2 98%   BMI 30.83 kg/m   Physical Exam  Constitutional: He appears well-developed and well-nourished.  HENT:  Head: Normocephalic and atraumatic.  Right Ear: Tympanic membrane normal.  Left Ear: Tympanic membrane normal.  Nose: No mucosal edema or rhinorrhea.  Mouth/Throat: Mucous membranes are normal. Posterior oropharyngeal erythema present. No oropharyngeal exudate or posterior oropharyngeal edema.  Eyes: Conjunctivae are normal.  Neck: Normal range of motion.  Cardiovascular: Normal rate, regular rhythm, normal heart sounds and intact distal pulses.   Pulmonary/Chest: Effort normal. No respiratory distress. He has no wheezes. He exhibits no tenderness.  Intermittent crackle left base, clears with cough.  Abdominal: Soft. Bowel sounds are normal. He exhibits no distension and no mass. There is no tenderness. There is no guarding.  Musculoskeletal: Normal range of motion.  Lymphadenopathy:  No head or neck adenopathy  Neurological: He is alert.  Skin: Skin is warm and dry.  Psychiatric: He has a normal mood and affect.  Nursing note and vitals reviewed.    ED Treatments / Results  Labs (all labs ordered are listed, but only abnormal results are displayed) Labs Reviewed  RAPID STREP SCREEN (NOT [facility])  CULTURE, GROUP A STREP [facility])    EKG  EKG Interpretation None       Radiology Dg Chest 2 View  Result Date: 08/25/2016 CLINICAL DATA:  Non productive cough,fever, sob and chest tightness x 1 week/ex smoker EXAM: CHEST - 2 VIEW COMPARISON:  04/23/2010 FINDINGS: Somewhat coarse perihilar interstitial markings. No confluent airspace disease or overt edema. There is some central peribronchial thickening. Heart size and mediastinal contours are within normal limits. No effusion. Visualized bones  unremarkable. IMPRESSION: 1. Mild perihilar interstitial disease.  No focal infiltrate Electronically Signed   By: Corlis Leak  Hassell M.D.   On: 08/25/2016 09:05    Procedures Procedures (including critical care time)  Medications Ordered in ED Medications  benzonatate (TESSALON) capsule 200 mg (200 mg Oral Given 08/25/16 0844)  ibuprofen (ADVIL,MOTRIN) tablet 800 mg (800 mg Oral Given 08/25/16 0843)  ondansetron (ZOFRAN-ODT) disintegrating tablet 8 mg (8 mg Oral Given 08/25/16 0849)     Initial Impression / Assessment and Plan / ED Course  I have reviewed the triage vital signs and the nursing notes.  Pertinent labs & imaging results that were available during my care of the patient were reviewed by me and considered in my medical decision making (see chart for details).  Clinical Course     Exam/imaging c/w acute bronchitis. Tessalon for cough, will cover with abx, zithromax started.  Encouraged rest,  increased fluid intake, motrin prn.  F/u with pcp if sx persist or worsen.   The patient appears reasonably screened and/or stabilized for discharge and I doubt any other medical condition or other [facility] requiring further screening, evaluation, or treatment in the ED at this time prior to discharge.   Final Clinical Impressions(s) / ED Diagnoses   Final diagnoses:  Acute bronchitis, unspecified organism  Acute pharyngitis, unspecified etiology    New Prescriptions New Prescriptions   AZITHROMYCIN (ZITHROMAX Z-PAK) 250 MG TABLET    Take 2 tablets by mouth on day one followed by one tablet daily for 4 days.   BENZONATATE (TESSALON) 100 MG CAPSULE    Take 2 capsules (200 mg total) by mouth 3 (three) times daily as needed.   IBUPROFEN (ADVIL,MOTRIN) 600 MG TABLET    Take 1 tablet (600 mg total) by mouth every 6 (six) hours as needed.     Burgess AmorJulie Charnell Peplinski, PA-C 08/25/16 0940    Benjiman CoreNathan Pickering, MD 08/25/16 1536

## 2016-08-25 NOTE — Discharge Instructions (Signed)
Take the entire course of the antibiotics prescribed, tessalon if needed for cough relief.  Ibuprofen should help with sore throat.  You do have bronchitis, your strep test is negative.

## 2016-08-25 NOTE — ED Triage Notes (Signed)
Pt reports cough, sore throat and body aches x1 week.

## 2016-08-27 LAB — CULTURE, GROUP A STREP (THRC)

## 2019-03-08 ENCOUNTER — Emergency Department (HOSPITAL_COMMUNITY): Payer: Self-pay

## 2019-03-08 ENCOUNTER — Emergency Department (HOSPITAL_COMMUNITY)
Admission: EM | Admit: 2019-03-08 | Discharge: 2019-03-08 | Disposition: A | Discharge status: Home / Self Care | Payer: Self-pay | Attending: Emergency Medicine | Admitting: Emergency Medicine

## 2019-03-08 ENCOUNTER — Encounter (HOSPITAL_COMMUNITY): Payer: Self-pay | Admitting: Emergency Medicine

## 2019-03-08 ENCOUNTER — Other Ambulatory Visit: Payer: Self-pay

## 2019-03-08 DIAGNOSIS — Z79899 Other long term (current) drug therapy: Secondary | ICD-10-CM | POA: Insufficient documentation

## 2019-03-08 DIAGNOSIS — R1013 Epigastric pain: Secondary | ICD-10-CM | POA: Insufficient documentation

## 2019-03-08 LAB — LIPASE, BLOOD: Lipase: 47 U/L (ref 11–51)

## 2019-03-08 LAB — COMPREHENSIVE METABOLIC PANEL
ALT: 30 U/L (ref 0–44)
AST: 23 U/L (ref 15–41)
Albumin: 3.9 g/dL (ref 3.5–5.0)
Alkaline Phosphatase: 82 U/L (ref 38–126)
Anion gap: 10 (ref 5–15)
BUN: 18 mg/dL (ref 6–20)
CO2: 25 mmol/L (ref 22–32)
Calcium: 8.8 mg/dL — ABNORMAL LOW (ref 8.9–10.3)
Chloride: 104 mmol/L (ref 98–111)
Creatinine, Ser: 0.72 mg/dL (ref 0.61–1.24)
GFR calc Af Amer: >60 mL/min (ref >60)
GFR calc non Af Amer: >60 mL/min (ref >60)
Glucose, Bld: 108 mg/dL — ABNORMAL HIGH (ref 70–99)
Potassium: 4.3 mmol/L (ref 3.5–5.1)
Sodium: 139 mmol/L (ref 135–145)
Total Bilirubin: 0.5 mg/dL (ref 0.3–1.2)
Total Protein: 6.7 g/dL (ref 6.5–8.1)

## 2019-03-08 LAB — CBC WITH DIFFERENTIAL/PLATELET
Abs Immature Granulocytes: 0 10*3/uL (ref 0.00–0.07)
Basophils Absolute: 0 10*3/uL (ref 0.0–0.1)
Basophils Relative: 0 %
Eosinophils Absolute: 0.1 10*3/uL (ref 0.0–0.5)
Eosinophils Relative: 2 %
HCT: 45.3 % (ref 39.0–52.0)
Hemoglobin: 15.4 g/dL (ref 13.0–17.0)
Immature Granulocytes: 0 %
Lymphocytes Relative: 34 %
Lymphs Abs: 1.9 10*3/uL (ref 0.7–4.0)
MCH: 31 pg (ref 26.0–34.0)
MCHC: 34 g/dL (ref 30.0–36.0)
MCV: 91.1 fL (ref 80.0–100.0)
Monocytes Absolute: 0.5 10*3/uL (ref 0.1–1.0)
Monocytes Relative: 10 %
Neutro Abs: 3.1 10*3/uL (ref 1.7–7.7)
Neutrophils Relative %: 54 %
Platelets: 256 10*3/uL (ref 150–400)
RBC: 4.97 MIL/uL (ref 4.22–5.81)
RDW: 11.9 % (ref 11.5–15.5)
WBC: 5.6 10*3/uL (ref 4.0–10.5)
nRBC: 0 % (ref 0.0–0.2)

## 2019-03-08 MED ORDER — FAMOTIDINE IN NACL 20-0.9 MG/50ML-% IV SOLN
20.0000 mg | Freq: Once | INTRAVENOUS | Status: AC
  Administered 2019-03-08: 20 mg via INTRAVENOUS
  Filled 2019-03-08: qty 50

## 2019-03-08 MED ORDER — ONDANSETRON HCL 4 MG/2ML IJ SOLN
4.0000 mg | INTRAMUSCULAR | Status: DC | PRN
  Filled 2019-03-08: qty 2

## 2019-03-08 MED ORDER — ONDANSETRON 4 MG PO TBDP: 4.0000 mg | ORAL_TABLET | Freq: Three times a day (TID) | ORAL | 0 refills | Status: DC | PRN

## 2019-03-08 MED ORDER — FAMOTIDINE 20 MG PO TABS: 20.0000 mg | ORAL_TABLET | Freq: Every day | ORAL | 0 refills | Status: DC

## 2019-03-08 NOTE — Discharge Instructions (Signed)
Eat a bland diet, avoiding greasy, fatty, fried foods, as well as spicy and acidic foods or beverages.  Avoid eating within 2 to 3 hours before going to bed or laying down.  Also avoid teas, colas, coffee, chocolate, pepermint and spearment.  Increase your fluids for the next few days. Take the prescriptions as directed. May also take over the counter maalox/mylanta, as directed on packaging, as needed for discomfort. Call your regular medical doctor on Monday to schedule a follow up appointment this week.  Return to the Emergency Department immediately if worsening.

## 2019-03-08 NOTE — ED Provider Notes (Signed)
[facility] EMERGENCY DEPARTMENT Provider Note   CSN: [id number] Arrival date & time: 03/08/19  0740     History   Chief Complaint Chief Complaint  Patient presents with   Abdominal Pain    HPI Brandon Roberts is a 28 y.o. male.     HPI  Pt was seen at 0815.  Per pt, c/o gradual onset and persistence of constant mid-epigastric abd "pain" since approximately 0600 this morning when he woke up. Pt states to took miralax because he had a "small hard BM" yesterday.  Has been associated with mild nausea.  Describes the abd pain as "burning," with radiation into his back.  Pt endorses hx of IBS "but this feels different." Denies vomiting/diarrhea, no fevers, no back pain, no rash, no CP/SOB, no black or blood in stools, no injury, no cough, no dysuria/hematuria, no testicular pain/swelling.      Past Medical History:  Diagnosis Date   ADD (attention deficit disorder)    Anxiety    Bipolar disorder (HCC)    Depression    Irritable bowel syndrome (IBS)    Pseudoseizure    Syncopal episodes    being monitored for cardiac causation    Patient Active Problem List   Diagnosis Date Noted   Bipolar affective disorder (HCC) 01/17/2012    Class: Acute   SPASM OF MUSCLE 08/17/2010   ANXIETY 01/10/2010   DEPRESSION 01/10/2010   ALLERGIC RHINITIS 01/10/2010   SHOULDER PAIN, BILATERAL 01/10/2010   INSOMNIA-SLEEP DISORDER-UNSPEC 01/10/2010   SYNCOPE 04/26/2009    Past Surgical History:  Procedure Laterality Date   heart monitor implant     SHOULDER SURGERY          Home Medications    Prior to Admission medications   Medication Sig Start Date End Date Taking? Authorizing Provider  amoxicillin-clavulanate (AUGMENTIN) 875-125 MG tablet Take 1 tablet by mouth 2 (two) times daily. One po bid x 7 days 07/24/15   Mesner, Barbara CowerJason, MD  azithromycin (ZITHROMAX Z-PAK) 250 MG tablet Take 2 tablets by mouth on day one followed by one tablet daily for 4 days. 08/25/16    Burgess AmorIdol, Julie, PA-C  benzonatate (TESSALON) 100 MG capsule Take 2 capsules (200 mg total) by mouth 3 (three) times daily as needed. 08/25/16   Burgess AmorIdol, Julie, PA-C  busPIRone (BUSPAR) 7.5 MG tablet Take 1 tablet (7.5 mg total) by mouth 2 (two) times daily. For anxiety 12/13/12   Armandina StammerNwoko, Agnes I, NP  ibuprofen (ADVIL,MOTRIN) 600 MG tablet Take 1 tablet (600 mg total) by mouth every 6 (six) hours as needed. 08/25/16   Burgess AmorIdol, Julie, PA-C  lamoTRIgine (LAMICTAL) 150 MG tablet Take 2 tablets (300 mg total) by mouth at bedtime. For mood stabilization 12/13/12   Armandina StammerNwoko, Agnes I, NP  topiramate (TOPAMAX) 25 MG tablet Take 1 tablet (25 mg total) by mouth 2 (two) times daily. For mood stabilization/seizure activities 12/13/12   Armandina StammerNwoko, Agnes I, NP  traZODone (DESYREL) 50 MG tablet Take 1 tablet (50 mg total) by mouth at bedtime and may repeat dose one time if needed. For depression/sleep 12/13/12   Sanjuana KavaNwoko, Agnes I, NP    Family History Family History  Problem Relation Age of Onset   Hypertension Mother    Migraines Mother    Drug abuse Mother    Drug abuse Father    Drug abuse Paternal Grandfather     Social History Social History   Tobacco Use   Smoking status: Never Smoker   Smokeless tobacco: Never  Used  Substance Use Topics   Alcohol use: Yes    Comment: rarely   Drug use: No     Allergies   Patient has no known allergies.   Review of Systems Review of Systems ROS: Statement: All systems negative except as marked or noted in the HPI; Constitutional: Negative for fever and chills. ; ; Eyes: Negative for eye pain, redness and discharge. ; ; ENMT: Negative for ear pain, hoarseness, nasal congestion, sinus pressure and sore throat. ; ; Cardiovascular: Negative for chest pain, palpitations, diaphoresis, dyspnea and peripheral edema. ; ; Respiratory: Negative for cough, wheezing and stridor. ; ; Gastrointestinal: +nausea, abd pain. Negative for vomiting, diarrhea, blood in stool, hematemesis,  jaundice and rectal bleeding. . ; ; Genitourinary: Negative for dysuria, flank pain and hematuria. ; ; Genital:  No penile drainage or rash, no testicular pain or swelling, no scrotal rash or swelling. ;; Musculoskeletal: Negative for back pain and neck pain. Negative for swelling and trauma.; ; Skin: Negative for pruritus, rash, abrasions, blisters, bruising and skin lesion.; ; Neuro: Negative for headache, lightheadedness and neck stiffness. Negative for weakness, altered level of consciousness, altered mental status, extremity weakness, paresthesias, involuntary movement, seizure and syncope.       Physical Exam Updated Vital Signs BP 127/73 (BP Location: Right Arm)    Pulse 63    Temp 97.8 F (36.6 C) (Oral)    Resp 16    Ht 5\' 5"  (1.651 m)    Wt 83.9 kg    SpO2 100%    BMI 30.79 kg/m   Physical Exam 0820: Physical examination:  Nursing notes reviewed; Vital signs and O2 SAT reviewed;  Constitutional: Well developed, Well nourished, Well hydrated, In no acute distress; Head:  Normocephalic, atraumatic; Eyes: EOMI, PERRL, No scleral icterus; ENMT: Mouth and pharynx normal, Mucous membranes moist; Neck: Supple, Full range of motion, No lymphadenopathy; Cardiovascular: Regular rate and rhythm, No gallop; Respiratory: Breath sounds clear & equal bilaterally, No wheezes.  Speaking full sentences with ease, Normal respiratory effort/excursion; Chest: Nontender, Movement normal; Abdomen: Soft, +mild mid-epigastric tenderness to palp. No rebound or guarding. Nondistended, Normal bowel sounds; Genitourinary: No CVA tenderness; Extremities: Peripheral pulses normal, No tenderness, No edema, No calf edema or asymmetry.; Neuro: AA&Ox3, Major CN grossly intact.  Speech clear. No gross focal motor or sensory deficits in extremities.; Skin: Color normal, Warm, Dry.   ED Treatments / Results  Labs (all labs ordered are listed, but only abnormal results are  displayed)   EKG None  Radiology   Procedures Procedures (including critical care time)  Medications Ordered in ED Medications  famotidine (PEPCID) IVPB 20 mg premix (has no administration in time range)  ondansetron (ZOFRAN) injection 4 mg (has no administration in time range)     Initial Impression / Assessment and Plan / ED Course  I have reviewed the triage vital signs and the nursing notes.  Pertinent labs & imaging results that were available during my care of the patient were reviewed by me and considered in my medical decision making (see chart for details).    MDM Reviewed: previous chart, nursing note and vitals Reviewed previous: labs Interpretation: labs, x-ray and ultrasound    Results for orders placed or performed during the hospital encounter of 03/08/19  Comprehensive metabolic panel  Result Value Ref Range   Sodium 139 135 - 145 mmol/L   Potassium 4.3 3.5 - 5.1 mmol/L   Chloride 104 98 - 111 mmol/L   CO2 25 22 -  32 mmol/L   Glucose, Bld 108 (H) 70 - 99 mg/dL   BUN 18 6 - 20 mg/dL   Creatinine, Ser 1.190.72 0.61 - 1.24 mg/dL   Calcium 8.8 (L) 8.9 - 10.3 mg/dL   Total Protein 6.7 6.5 - 8.1 g/dL   Albumin 3.9 3.5 - 5.0 g/dL   AST 23 15 - 41 U/L   ALT 30 0 - 44 U/L   Alkaline Phosphatase 82 38 - 126 U/L   Total Bilirubin 0.5 0.3 - 1.2 mg/dL   GFR calc non Af Amer >60 >60 mL/min   GFR calc Af Amer >60 >60 mL/min   Anion gap 10 5 - 15  Lipase, blood  Result Value Ref Range   Lipase 47 11 - 51 U/L  CBC with Differential  Result Value Ref Range   WBC 5.6 4.0 - 10.5 K/uL   RBC 4.97 4.22 - 5.81 MIL/uL   Hemoglobin 15.4 13.0 - 17.0 g/dL   HCT 14.745.3 82.939.0 - 56.252.0 %   MCV 91.1 80.0 - 100.0 fL   MCH 31.0 26.0 - 34.0 pg   MCHC 34.0 30.0 - 36.0 g/dL   RDW 13.011.9 86.511.5 - 78.415.5 %   Platelets 256 150 - 400 K/uL   nRBC 0.0 0.0 - 0.2 %   Neutrophils Relative % 54 %   Neutro Abs 3.1 1.7 - 7.7 K/uL   Lymphocytes Relative 34 %   Lymphs Abs 1.9 0.7 - 4.0 K/uL    Monocytes Relative 10 %   Monocytes Absolute 0.5 0.1 - 1.0 K/uL   Eosinophils Relative 2 %   Eosinophils Absolute 0.1 0.0 - 0.5 K/uL   Basophils Relative 0 %   Basophils Absolute 0.0 0.0 - 0.1 K/uL   Immature Granulocytes 0 %   Abs Immature Granulocytes 0.00 0.00 - 0.07 K/uL   [place] Abdomen Complete Result Date: 03/08/2019 CLINICAL DATA:  Epigastric pain EXAM: ABDOMEN ULTRASOUND COMPLETE COMPARISON:  None. FINDINGS: Gallbladder: Negative for gallstones, wall thickening, pericholecystic fluid, or Murphy's sign. Wall thickness is 1.7 mm. Incidental polyp in the fundus measures 3 mm. Common bile duct: Diameter: 2 mm Liver: No focal lesion identified. Within normal limits in parenchymal echogenicity. Portal vein is patent on color Doppler imaging with normal direction of blood flow towards the liver. IVC: No abnormality visualized. Pancreas: Visualized portion unremarkable. Spleen: Size and appearance within normal limits. Right Kidney: Length: 10.8 cm. Echogenicity within normal limits. No mass or hydronephrosis visualized. Left Kidney: Length: 12.2 cm. Echogenicity within normal limits. No mass or hydronephrosis visualized. Abdominal aorta: No aneurysm visualized. Other findings: No free fluid or ascites IMPRESSION: Incidental gallbladder polyp. Negative for gallstones or biliary dilatation. No other acute finding by ultrasound. Electronically Signed   By: Judie PetitM.  Shick M.D.   On: 03/08/2019 10:30   Dg Abd Acute W/chest Result Date: 03/08/2019 CLINICAL DATA:  Epigastric pain. EXAM: DG ABDOMEN ACUTE W/ 1V CHEST COMPARISON:  Chest x-ray dated August 25, 2016. Abdominal x-rays dated May 27, 2009. FINDINGS: The cardiomediastinal silhouette is normal in size. Normal pulmonary vascularity. Chronic coarse interstitial markings, similar to prior study. No focal consolidation, pleural effusion, or pneumothorax. No acute osseous abnormality. There is no evidence of dilated bowel loops or free intraperitoneal air. No  radiopaque calculi or other significant radiographic abnormality is seen. Multiple phleboliths in the pelvis. IMPRESSION: Negative abdominal radiographs.  No acute cardiopulmonary disease. Electronically Signed   By: Obie DredgeWilliam T Derry M.D.   On: 03/08/2019 09:23    Brandon Roberts  was evaluated in Emergency Department on 03/08/2019 for the symptoms described in the history of present illness. He was evaluated in the context of the global COVID-19 pandemic, which necessitated consideration that the patient might be at risk for infection with the SARS-CoV-2 virus that causes COVID-19. Institutional protocols and algorithms that pertain to the evaluation of patients at risk for COVID-19 are in a state of rapid change based on information released by regulatory bodies including the CDC and federal and state organizations. These policies and algorithms were followed during the patient's care in the ED.   1255:  Pt has tol PO well while in the ED without N/V.  No stooling while in the ED.  Abd benign, resps easy, VSS. Feels better and wants to go home now. Workup reassuring. Tx symptomatically at this time. Dx and testing d/w pt.  Questions answered.  Verb understanding, agreeable to d/c home with outpt f/u.    Final Clinical Impressions(s) / ED Diagnoses   Final diagnoses:  None    ED Discharge Orders    None       Francine Graven, DO 03/10/19 [id number]

## 2019-03-08 NOTE — ED Triage Notes (Signed)
Pt woke with epigastric pain moving through to back with no n/v/d this morning.

## 2019-03-08 NOTE — ED Notes (Signed)
Pt was given water to drink. Pt kept the water down fine.

## 2019-05-13 ENCOUNTER — Ambulatory Visit (INDEPENDENT_AMBULATORY_CARE_PROVIDER_SITE_OTHER): Payer: BC Managed Care – PPO

## 2019-05-13 ENCOUNTER — Other Ambulatory Visit: Payer: Self-pay | Admitting: Podiatry

## 2019-05-13 ENCOUNTER — Ambulatory Visit: Payer: BC Managed Care – PPO | Admitting: Podiatry

## 2019-05-13 ENCOUNTER — Encounter: Payer: Self-pay | Admitting: Podiatry

## 2019-05-13 ENCOUNTER — Other Ambulatory Visit: Payer: Self-pay

## 2019-05-13 VITALS — BP 116/70 | HR 79 | Resp 16

## 2019-05-13 DIAGNOSIS — M722 Plantar fascial fibromatosis: Secondary | ICD-10-CM | POA: Diagnosis not present

## 2019-05-13 DIAGNOSIS — M79672 Pain in left foot: Secondary | ICD-10-CM | POA: Diagnosis not present

## 2019-05-13 MED ORDER — DICLOFENAC SODIUM 75 MG PO TBEC: 75.0000 mg | DELAYED_RELEASE_TABLET | Freq: Two times a day (BID) | ORAL | 2 refills | Status: DC

## 2019-05-13 NOTE — Progress Notes (Signed)
   Subjective:    Patient ID: Brandon Roberts, male    DOB: [birth year], 29 y.o.   MRN: [id number]  HPI    Review of Systems  All other systems reviewed and are negative.      Objective:   Physical Exam        Assessment & Plan:

## 2019-05-13 NOTE — Patient Instructions (Signed)

## 2019-05-17 NOTE — Progress Notes (Signed)
Subjective:   Patient ID: Brandon Roberts, male   DOB: 28 y.o.   MRN: [id number]   HPI Patient presents with painful heel left at the insertional point of the tendon into the calcaneus with inflammation fluid around the medial band.  Patient states it is been going on for around 2 months and gradually becoming more of an issue.  Patient does not smoke likes to be active   Review of Systems  All other systems reviewed and are negative.       Objective:  Physical Exam Vitals signs and nursing note reviewed.  Constitutional:      Appearance: He is well-developed.  Pulmonary:     Effort: Pulmonary effort is normal.  Musculoskeletal: Normal range of motion.  Skin:    General: Skin is warm.  Neurological:     Mental Status: He is alert.     Neurovascular status intact muscle strength is adequate range of motion within normal limits with patient found to have exquisite discomfort plantar aspect left heel at the insertional point tendon calcaneus with inflammation fluid around the medial band     Assessment:  Acute plantar fasciitis left with inflammation fluid around the medial band     Plan:  H&P condition reviewed sterile prep applied and injected the fascia left 3 mg Kenalog 5 mg Xylocaine and gave instructions for physical therapy anti-inflammatory usage.  Reappoint for [place] to recheck again  X-ray did not indicate spur with moderate depression of the arch and no indication to stress fracture arthritis signed visit

## 2019-07-27 ENCOUNTER — Emergency Department (HOSPITAL_COMMUNITY): Payer: BC Managed Care – PPO

## 2019-07-27 ENCOUNTER — Encounter (HOSPITAL_COMMUNITY): Payer: Self-pay | Admitting: Emergency Medicine

## 2019-07-27 ENCOUNTER — Emergency Department (HOSPITAL_COMMUNITY)
Admission: EM | Admit: 2019-07-27 | Discharge: 2019-07-27 | Disposition: A | Discharge status: Home / Self Care | Payer: BC Managed Care – PPO | Attending: Emergency Medicine | Admitting: Emergency Medicine

## 2019-07-27 ENCOUNTER — Other Ambulatory Visit: Payer: Self-pay

## 2019-07-27 DIAGNOSIS — R0789 Other chest pain: Secondary | ICD-10-CM | POA: Insufficient documentation

## 2019-07-27 DIAGNOSIS — R079 Chest pain, unspecified: Secondary | ICD-10-CM

## 2019-07-27 LAB — COMPREHENSIVE METABOLIC PANEL
ALT: 56 U/L — ABNORMAL HIGH (ref 0–44)
AST: 27 U/L (ref 15–41)
Albumin: 4.3 g/dL (ref 3.5–5.0)
Alkaline Phosphatase: 89 U/L (ref 38–126)
Anion gap: 6 (ref 5–15)
BUN: 18 mg/dL (ref 6–20)
CO2: 24 mmol/L (ref 22–32)
Calcium: 8.9 mg/dL (ref 8.9–10.3)
Chloride: 108 mmol/L (ref 98–111)
Creatinine, Ser: 0.93 mg/dL (ref 0.61–1.24)
GFR calc Af Amer: >60 mL/min (ref >60)
GFR calc non Af Amer: >60 mL/min (ref >60)
Glucose, Bld: 108 mg/dL — ABNORMAL HIGH (ref 70–99)
Potassium: 3.8 mmol/L (ref 3.5–5.1)
Sodium: 138 mmol/L (ref 135–145)
Total Bilirubin: 0.3 mg/dL (ref 0.3–1.2)
Total Protein: 7.2 g/dL (ref 6.5–8.1)

## 2019-07-27 LAB — CBC WITH DIFFERENTIAL/PLATELET
Abs Immature Granulocytes: 0.01 10*3/uL (ref 0.00–0.07)
Basophils Absolute: 0 10*3/uL (ref 0.0–0.1)
Basophils Relative: 0 %
Eosinophils Absolute: 0.2 10*3/uL (ref 0.0–0.5)
Eosinophils Relative: 2 %
HCT: 47.1 % (ref 39.0–52.0)
Hemoglobin: 16 g/dL (ref 13.0–17.0)
Immature Granulocytes: 0 %
Lymphocytes Relative: 44 %
Lymphs Abs: 3.1 10*3/uL (ref 0.7–4.0)
MCH: 31.3 pg (ref 26.0–34.0)
MCHC: 34 g/dL (ref 30.0–36.0)
MCV: 92 fL (ref 80.0–100.0)
Monocytes Absolute: 0.5 10*3/uL (ref 0.1–1.0)
Monocytes Relative: 7 %
Neutro Abs: 3.4 10*3/uL (ref 1.7–7.7)
Neutrophils Relative %: 47 %
Platelets: 248 10*3/uL (ref 150–400)
RBC: 5.12 MIL/uL (ref 4.22–5.81)
RDW: 11.9 % (ref 11.5–15.5)
WBC: 7.2 10*3/uL (ref 4.0–10.5)
nRBC: 0 % (ref 0.0–0.2)

## 2019-07-27 LAB — LIPASE, BLOOD: Lipase: 22 U/L (ref 11–51)

## 2019-07-27 MED ORDER — NAPROXEN 500 MG PO TABS: 500.0000 mg | ORAL_TABLET | Freq: Two times a day (BID) | ORAL | 0 refills | Status: DC | PRN

## 2019-07-27 NOTE — ED Provider Notes (Signed)
[facility] EMERGENCY DEPARTMENT Provider Note   CSN: [id number] Arrival date & time: 07/27/19  1900     History   Chief Complaint Chief Complaint  Patient presents with  . Chest Pain    HPI Lady Garyndre R Gaines is a 28 y.o. male with PMH significant for anxiety and GERD who presents to the ED with a 3-day history of 5 out of 10 sharp, central, intermittent chest pain.  He reports that it is worse with lying down and deep inspiration, but is not readily reproducible.  His episodes last approximately 30 seconds in duration and then his chest feels generally "sore".  He also endorses a mild headache, same duration of time.  He works a physically demanding position at [employer] and suspects that it could be musculoskeletal.  While he endorses possibility of sick coworkers, he denies any obvious sick contacts.  He admits that he has had increased reflux recently and has trouble burping.  He also reports that his grandfather passed away recently which may be contributing to mildly increased anxiety.  He has not taken anything for his discomfort.  He denies any recent illness, fevers or chills, dizziness, cough, heart palpitations, nausea or vomiting, or other symptoms.       HPI  Past Medical History:  Diagnosis Date  . ADD (attention deficit disorder)   . Anxiety   . Bipolar disorder (HCC)   . Depression   . Irritable bowel syndrome (IBS)   . Pseudoseizure   . Syncopal episodes    being monitored for cardiac causation    Patient Active Problem List   Diagnosis Date Noted  . Bipolar affective disorder (HCC) 01/17/2012    Class: Acute  . SPASM OF MUSCLE 08/17/2010  . ANXIETY 01/10/2010  . DEPRESSION 01/10/2010  . ALLERGIC RHINITIS 01/10/2010  . SHOULDER PAIN, BILATERAL 01/10/2010  . INSOMNIA-SLEEP DISORDER-UNSPEC 01/10/2010  . SYNCOPE 04/26/2009    Past Surgical History:  Procedure Laterality Date  . heart monitor implant    . SHOULDER SURGERY          Home  Medications    Prior to Admission medications   Medication Sig Start Date End Date Taking? Authorizing Provider  dicyclomine (BENTYL) 20 MG tablet Take 20 mg by mouth every 6 (six) hours as needed for spasms.   Yes [provider]  diclofenac (VOLTAREN) 75 MG EC tablet Take 1 tablet (75 mg total) by mouth 2 (two) times daily. Patient not taking: Reported on 07/27/2019 05/13/19   Lenn Sinkegal, Norman S, DPM  famotidine (PEPCID) 20 MG tablet Take 1 tablet (20 mg total) by mouth daily. Patient not taking: Reported on 07/27/2019 03/08/19   Samuel JesterMcManus, Kathleen, DO  naproxen (NAPROSYN) 500 MG tablet Take 1 tablet (500 mg total) by mouth 2 (two) times daily between meals as needed for moderate pain. 07/27/19   Lorelee NewGreen, Azalyn Sliwa L, PA-C  ondansetron (ZOFRAN ODT) 4 MG disintegrating tablet Take 1 tablet (4 mg total) by mouth every 8 (eight) hours as needed for nausea or vomiting. Patient not taking: Reported on 07/27/2019 03/08/19   Samuel JesterMcManus, Kathleen, DO    Family History Family History  Problem Relation Age of Onset  . Hypertension Mother   . Migraines Mother   . Drug abuse Mother   . Drug abuse Father   . Drug abuse Paternal Grandfather     Social History Social History   Tobacco Use  . Smoking status: Never Smoker  . Smokeless tobacco: Never Used  Substance Use Topics  .  Alcohol use: Yes    Comment: rarely  . Drug use: No     Allergies   Patient has no known allergies.   Review of Systems Review of Systems  All other systems reviewed and are negative.    Physical Exam Updated Vital Signs BP (!) 142/89 (BP Location: Right Arm)   Pulse 94   Temp 99 F (37.2 C) (Oral)   Resp 16   Ht 5\' 5"  (1.651 m)   Wt 83.9 kg   SpO2 98%   BMI 30.79 kg/m   Physical Exam Vitals signs and nursing note reviewed. Exam conducted with a chaperone present.  Constitutional:      Appearance: Normal appearance.  HENT:     Head: Normocephalic and atraumatic.     Mouth/Throat:     Pharynx:  Oropharynx is clear.  Eyes:     General: No scleral icterus.    Conjunctiva/sclera: Conjunctivae normal.  Neck:     Musculoskeletal: Normal range of motion and neck supple.  Cardiovascular:     Rate and Rhythm: Normal rate and regular rhythm.     Pulses: Normal pulses.     Heart sounds: Normal heart sounds.  Pulmonary:     Effort: Pulmonary effort is normal. No respiratory distress.     Breath sounds: Normal breath sounds. No wheezing or rales.  Chest:     Chest wall: No tenderness.  Abdominal:     General: Abdomen is flat. There is no distension.     Palpations: Abdomen is soft.     Tenderness: There is no abdominal tenderness. There is no guarding.  Skin:    General: Skin is dry.  Neurological:     Mental Status: He is alert.     GCS: GCS eye subscore is 4. GCS verbal subscore is 5. GCS motor subscore is 6.  Psychiatric:        Mood and Affect: Mood normal.        Behavior: Behavior normal.        Thought Content: Thought content normal.      ED Treatments / Results  Labs (all labs ordered are listed, but only abnormal results are displayed) Labs Reviewed  COMPREHENSIVE METABOLIC PANEL - Abnormal; Notable for the following components:      Result Value   Glucose, Bld 108 (*)    ALT 56 (*)    All other components within normal limits  CBC WITH DIFFERENTIAL/PLATELET  LIPASE, BLOOD    EKG EKG Interpretation  Date/Time:  Sunday July 27 2019 19:16:42 EST Ventricular Rate:  81 PR Interval:    QRS Duration: 80 QT Interval:  354 QTC Calculation: 411 R Axis:   0 Text Interpretation: Sinus rhythm Confirmed by 01-28-1988 [phone]) on 07/27/2019 7:30:02 PM   Radiology Dg Chest Portable 1 View  Result Date: 07/27/2019 CLINICAL DATA:  Pt c/o of chest pain x 3 days. Pt says that the pain is in the center of his chest, feels like a pressure and happens more when he lays down. EXAM: PORTABLE CHEST 1 VIEW COMPARISON:  08/25/2016. FINDINGS: The heart size and  mediastinal contours are within normal limits. Both lungs are clear. No pleural effusion or pneumothorax. The visualized skeletal structures are unremarkable. IMPRESSION: Normal frontal chest radiograph. Electronically Signed   By: 08/27/2016 M.D.   On: 07/27/2019 19:47    Procedures Procedures (including critical care time)  Medications Ordered in ED Medications - No data to display   Initial Impression / Assessment  and Plan / ED Course  I have reviewed the triage vital signs and the nursing notes.  Pertinent labs & imaging results that were available during my care of the patient were reviewed by me and considered in my medical decision making (see chart for details).        While patient endorses physically demanding position at work, chest wall discomfort is not reproducible with palpation.  DG chest was interpreted and demonstrates no acute cardiopulmonary findings.  Breath sounds intact bilaterally and no abnormal sounds auscultated on exam.  Do not suspect spontaneous pneumothorax.  EKG demonstrated normal sinus rhythm and his CMP demonstrated no electrolyte abnormalities that may contribute to arrhythmia.  CBC demonstrated no anemia or leukocytosis concerning for infection.  Patient is PERC negative as he denies any history of clots, clotting disorder, leg swelling or immobilization, no recent surgery, hormone replacement, and he is neither tachycardic, tachypneic, or hypoxic.  Will not obtain D-dimer and have low suspicion for PE.  Patient is resting comfortably on exam is in no acute distress.  His vital signs are within normal limits, with exception of mildly elevated BP.  Lipase was normal and do not suspect pancreatitis.  Patient is safe for discharge and is instructed to follow-up with his PCP for ongoing evaluation and management.  In the interim, will prescribe patient naproxen for possible musculoskeletal involvement.  Recommend to continue taking his famotidine and  omeprazole, as prescribed.  Also encouraged him to try antacids or Gas-X over-the-counter for symptomatic relief.  Strict return precautions discussed with the patient. All of the evaluation and work-up results were discussed with the patient and any family at bedside. They were provided opportunity to ask any additional questions and have none at this time. They have expressed understanding of verbal discharge instructions as well as return precautions and are agreeable to the plan.    Final Clinical Impressions(s) / ED Diagnoses   Final diagnoses:  Chest pain, unspecified type    ED Discharge Orders         Ordered    naproxen (NAPROSYN) 500 MG tablet  2 times daily between meals PRN     07/27/19 2009           Reita Chard 07/27/19 2013    Maudie Flakes, MD 07/28/19 [phone]

## 2019-07-27 NOTE — Discharge Instructions (Signed)
Please be sure to continue taking your Prilosec and famotidine, as prescribed.  You may also try Gas-X over-the-counter for symptomatic relief.  Please take naproxen, as needed, for chest wall soreness that you suspect may be related to your physically demanding position.  Please follow-up with your PCP as this may also be related to your anxiety.

## 2019-07-27 NOTE — ED Triage Notes (Signed)
Pt c/o of chest pain x 3 days. Pt says that the pain is in the center of his chest, feels like a pressure and happens more when he lays down.

## 2019-10-17 ENCOUNTER — Other Ambulatory Visit: Payer: Self-pay

## 2019-10-17 ENCOUNTER — Ambulatory Visit: Payer: BC Managed Care – PPO | Attending: Internal Medicine

## 2019-10-17 DIAGNOSIS — Z20822 Contact with and (suspected) exposure to covid-19: Secondary | ICD-10-CM

## 2019-10-18 LAB — NOVEL CORONAVIRUS, NAA: SARS-CoV-2, NAA: NOT DETECTED

## 2019-10-22 ENCOUNTER — Encounter: Payer: Self-pay | Admitting: Podiatry

## 2019-10-22 ENCOUNTER — Other Ambulatory Visit: Payer: Self-pay

## 2019-10-22 ENCOUNTER — Ambulatory Visit (INDEPENDENT_AMBULATORY_CARE_PROVIDER_SITE_OTHER): Payer: BC Managed Care – PPO | Admitting: Podiatry

## 2019-10-22 VITALS — Temp 98.0°F

## 2019-10-22 DIAGNOSIS — M722 Plantar fascial fibromatosis: Secondary | ICD-10-CM

## 2019-10-22 MED ORDER — MELOXICAM 15 MG PO TABS: 15.0000 mg | ORAL_TABLET | Freq: Every day | ORAL | 2 refills | Status: AC

## 2019-10-22 NOTE — Progress Notes (Signed)
Subjective:   Patient ID: Brandon Roberts, male   DOB: 29 y.o.   MRN: [id number]   HPI Patient states his heel has gotten worse and is very sore when he tries to walk.  States it is gotten worse over the last few months and feels like he is walking on a raw surface   ROS      Objective:  Physical Exam  Neurovascular status intact with exquisite discomfort medial and central band plantar fascia upon palpation     Assessment:  Acute plantar fasciitis left not responding so far conservatively H     Plan:  NP condition reviewed due to intensity of discomfort I reinjected the fascia after sterile prep 3 mg Kenalog 5 mg Xylocaine and applied air fracture walker to completely immobilize the plantar surface.  Reappoint for [place] to recheck 3 weeks and may require surgical intervention at 1 point in future

## 2019-11-12 ENCOUNTER — Ambulatory Visit: Payer: BC Managed Care – PPO | Admitting: Podiatry

## 2019-11-13 ENCOUNTER — Encounter: Payer: Self-pay | Admitting: Orthopaedic Surgery

## 2019-11-13 ENCOUNTER — Ambulatory Visit: Payer: BC Managed Care – PPO | Admitting: Orthopaedic Surgery

## 2019-12-11 ENCOUNTER — Encounter (HOSPITAL_COMMUNITY): Payer: Self-pay | Admitting: Emergency Medicine

## 2019-12-11 ENCOUNTER — Other Ambulatory Visit: Payer: Self-pay

## 2019-12-11 ENCOUNTER — Emergency Department (HOSPITAL_COMMUNITY)
Admission: EM | Admit: 2019-12-11 | Discharge: 2019-12-11 | Disposition: A | Discharge status: Home / Self Care | Payer: BC Managed Care – PPO | Attending: Emergency Medicine | Admitting: Emergency Medicine

## 2019-12-11 DIAGNOSIS — M79605 Pain in left leg: Secondary | ICD-10-CM | POA: Insufficient documentation

## 2019-12-11 DIAGNOSIS — M722 Plantar fascial fibromatosis: Secondary | ICD-10-CM | POA: Diagnosis not present

## 2019-12-11 MED ORDER — INDOMETHACIN 25 MG PO CAPS: 25.0000 mg | ORAL_CAPSULE | Freq: Three times a day (TID) | ORAL | 0 refills | Status: AC

## 2019-12-11 MED ORDER — RIVAROXABAN 15 MG PO TABS
15.0000 mg | ORAL_TABLET | Freq: Once | ORAL | Status: AC
  Administered 2019-12-11: 15 mg via ORAL
  Filled 2019-12-11 (×2): qty 1

## 2019-12-11 NOTE — ED Triage Notes (Signed)
Patient states left leg pain that started last night. Patient took Aleve around 8 p.m. with little or no relief. Patient denies any injury. Patient states pain is throbbing in left lower leg.  No swelling or redness noted.

## 2019-12-11 NOTE — ED Provider Notes (Signed)
[facility] EMERGENCY DEPARTMENT Provider Note   CSN: [id number] Arrival date & time: 12/11/19  0132   History Chief Complaint  Patient presents with  . Leg Pain    left    Brandon Roberts is a 29 y.o. male.  The history is provided by the patient.  Leg Pain He has history of bipolar disorder, attention deficit disorder, plantar fasciitis of the left foot and comes in complaining of pain in the left lower leg.  He has been having pain there for several years, but it has gotten worse over the last 2 months.  Pain is in the posterior lateral aspect of the distal left lower leg.  He has been taking meloxicam, which initially seemed to give him some relief, but now is not helping.  Tonight, he took naproxen in addition to the meloxicam without any benefit.  He has been seeing a podiatrist for plantar fasciitis on the same side.  He does note that the pain in his leg is worse if he is standing a lot, but nothing makes it any better.  Past Medical History:  Diagnosis Date  . ADD (attention deficit disorder)   . Anxiety   . Bipolar disorder ([place])   . Depression   . Irritable bowel syndrome (IBS)   . Pseudoseizure   . Syncopal episodes    being monitored for cardiac causation    Patient Active Problem List   Diagnosis Date Noted  . Bipolar affective disorder ([place]) 01/17/2012    Class: Acute  . SPASM OF MUSCLE 08/17/2010  . ANXIETY 01/10/2010  . DEPRESSION 01/10/2010  . ALLERGIC RHINITIS 01/10/2010  . SHOULDER PAIN, BILATERAL 01/10/2010  . INSOMNIA-SLEEP DISORDER-UNSPEC 01/10/2010  . SYNCOPE 04/26/2009    Past Surgical History:  Procedure Laterality Date  . heart monitor implant    . SHOULDER SURGERY         Family History  Problem Relation Age of Onset  . Hypertension Mother   . Migraines Mother   . Drug abuse Mother   . Drug abuse Father   . Drug abuse Paternal Grandfather     Social History   Tobacco Use  . Smoking status: Never Smoker  . Smokeless tobacco:  Never Used  Substance Use Topics  . Alcohol use: Yes    Comment: rarely  . Drug use: No    Home Medications Prior to Admission medications   Medication Sig Start Date End Date Taking? Authorizing Provider  ALPRAZolam Duanne Moron) 1 MG tablet Take 1 mg by mouth 3 (three) times daily as needed. 10/15/19   [provider]  diclofenac (VOLTAREN) 75 MG EC tablet Take 1 tablet (75 mg total) by mouth 2 (two) times daily. Patient not taking: Reported on 10/22/2019 05/13/19   Wallene Huh, DPM  dicyclomine (BENTYL) 20 MG tablet Take 20 mg by mouth every 6 (six) hours as needed for spasms.    [provider]  FLUoxetine (PROZAC) 20 MG capsule Take 20 mg by mouth daily. 10/13/19   [provider]  meloxicam (MOBIC) 15 MG tablet Take 1 tablet (15 mg total) by mouth daily. 10/22/19   Wallene Huh, DPM  naproxen (NAPROSYN) 500 MG tablet Take 1 tablet (500 mg total) by mouth 2 (two) times daily between meals as needed for moderate pain. 07/27/19   Corena Herter, PA-C  ondansetron (ZOFRAN ODT) 4 MG disintegrating tablet Take 1 tablet (4 mg total) by mouth every 8 (eight) hours as needed for nausea or vomiting.  Patient not taking: Reported on 10/22/2019 03/08/19   Samuel Jester, DO    Allergies    Patient has no known allergies.  Review of Systems   Review of Systems  All other systems reviewed and are negative.   Physical Exam Updated Vital Signs BP 128/82   Pulse 88   Temp 98.1 F (36.7 C)   Resp 16   Ht 5\' 6"  (1.676 m)   Wt 83.9 kg   SpO2 98%   BMI 29.86 kg/m   Physical Exam Vitals and nursing note reviewed.   29 year old male, resting comfortably and in no acute distress. Vital signs are normal. Oxygen saturation is 98%, which is normal. Head is normocephalic and atraumatic. PERRLA, EOMI. Oropharynx is clear. Neck is nontender and supple without adenopathy or JVD. Back is nontender and there is no CVA tenderness. Lungs are clear without rales, wheezes, or  rhonchi. Chest is nontender. Heart has regular rate and rhythm without murmur. Abdomen is soft, flat, nontender without masses or hepatosplenomegaly and peristalsis is normoactive. Extremities have no cyanosis or edema, full range of motion is present.  There is mild tenderness to palpation over the posterior lateral aspect of the left distal lower leg.  There is also pain with passive dorsiflexion and with plantarflexion against resistance.  There is no swelling or erythema or warmth.  No cord is palpable. Skin is warm and dry without rash. Neurologic: Mental status is normal, cranial nerves are intact, there are no motor or sensory deficits.  ED Results / Procedures / Treatments    Procedures Procedures  Medications Ordered in ED Medications  Rivaroxaban (XARELTO) tablet 15 mg (has no administration in time range)    ED Course  I have reviewed the triage vital signs and the nursing notes.  Left lower leg pain which appears to be muscular.  Old records are reviewed showing 2 visits to a podiatrist for injections in the foot for plantar fasciitis.  I suspect that he would benefit most from a stronger anti-inflammatory agent and physical therapy.  However, I do feel I need to rule out DVT.  He will be given a dose of rivaroxaban and brought back in the morning for venous ultrasound.  If negative, he is to start a 10-day course of indomethacin (discontinue meloxicam during that time).  He is referred back to his podiatrist, but also given referral to orthopedics.  MDM Rules/Calculators/A&P  Final Clinical Impression(s) / ED Diagnoses Final diagnoses:  Left leg pain    Rx / DC Orders ED Discharge Orders         Ordered    indomethacin (INDOCIN) 25 MG capsule  3 times daily     12/11/19 0226    02/10/20 Venous Img Lower Unilateral Left     12/11/19 0228           02/10/20, MD 12/11/19 [phone]

## 2019-12-11 NOTE — ED Notes (Signed)
Pt reports history of plantar fascitis of left foot.

## 2019-12-11 NOTE — ED Notes (Signed)
Called [facility] for Xarelto

## 2019-12-11 NOTE — Discharge Instructions (Signed)
Please return tomorrow for an ultrasound to make sure you do not have a blood clot in your leg.  If this test is positive, you will be given a prescription for a blood thinner to take.  If the test is positive, do not pick up the prescription for indomethacin.  When you are on the blood thinner, you must stop taking all anti-inflammatory medications including meloxicam and naproxen.  If the test is negative, pick up the prescription for indomethacin.  While taking indomethacin, do not take meloxicam or naproxen or any other nonsteroidal anti-inflammatory drug.  When you finish taking the indomethacin, then resume taking meloxicam.  Do not take naproxen while you are taking meloxicam.

## 2019-12-12 ENCOUNTER — Ambulatory Visit (HOSPITAL_COMMUNITY)
Admission: RE | Admit: 2019-12-12 | Discharge: 2019-12-12 | Disposition: A | Discharge status: Home / Self Care | Payer: BC Managed Care – PPO | Source: Ambulatory Visit | Attending: Emergency Medicine | Admitting: Emergency Medicine

## 2019-12-12 DIAGNOSIS — M79605 Pain in left leg: Secondary | ICD-10-CM

## 2019-12-24 ENCOUNTER — Other Ambulatory Visit: Payer: Self-pay

## 2019-12-24 ENCOUNTER — Ambulatory Visit: Payer: BC Managed Care – PPO | Admitting: Podiatry

## 2019-12-24 ENCOUNTER — Ambulatory Visit (INDEPENDENT_AMBULATORY_CARE_PROVIDER_SITE_OTHER): Payer: BC Managed Care – PPO

## 2019-12-24 ENCOUNTER — Encounter: Payer: Self-pay | Admitting: Podiatry

## 2019-12-24 ENCOUNTER — Other Ambulatory Visit: Payer: Self-pay | Admitting: Podiatry

## 2019-12-24 DIAGNOSIS — M722 Plantar fascial fibromatosis: Secondary | ICD-10-CM

## 2019-12-24 DIAGNOSIS — M79672 Pain in left foot: Secondary | ICD-10-CM

## 2019-12-24 MED ORDER — PREDNISONE 10 MG PO TABS: ORAL_TABLET | ORAL | 0 refills | Status: AC

## 2019-12-24 NOTE — Progress Notes (Signed)
Subjective:   Patient ID: Brandon Roberts, male   DOB: 29 y.o.   MRN: [id number]   HPI Patient states that the heel is still bothering him and he started develop pain in his leg went to the emergency room and had an ultrasound done which was negative for clot.  Complains mostly still around about the heel hurting him the most   ROS      Objective:  Physical Exam  Neurovascular status intact with inflammation pain of the plantar fascial left at the insertional point tendon calcaneus with patient having mild discomfort in the leg but not and negative for Homans' sign or acute injury currently     Assessment:  Difficult to say about acute plantar fascial inflammation versus the possibility of any other pathology     Plan:  Sterile prep injected the plantar fascial left 3 mg Kenalog 5 mg Xylocaine and instructed on anti-inflammatories and placed on Sterapred DS Dosepak for 2 weeks and also casted for functional orthotics to try to reduce 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

## 2019-12-31 ENCOUNTER — Encounter (HOSPITAL_COMMUNITY): Payer: Self-pay

## 2019-12-31 ENCOUNTER — Ambulatory Visit (HOSPITAL_COMMUNITY): Payer: BC Managed Care – PPO | Attending: Family Medicine | Admitting: Physical Therapy

## 2020-02-11 ENCOUNTER — Encounter (HOSPITAL_COMMUNITY): Payer: Self-pay

## 2020-02-11 ENCOUNTER — Emergency Department (HOSPITAL_COMMUNITY): Payer: Self-pay

## 2020-02-11 ENCOUNTER — Emergency Department (HOSPITAL_COMMUNITY)
Admission: EM | Admit: 2020-02-11 | Discharge: 2020-02-11 | Disposition: A | Discharge status: Home / Self Care | Payer: Self-pay | Attending: Emergency Medicine | Admitting: Emergency Medicine

## 2020-02-11 ENCOUNTER — Other Ambulatory Visit: Payer: Self-pay

## 2020-02-11 DIAGNOSIS — R0789 Other chest pain: Secondary | ICD-10-CM

## 2020-02-11 DIAGNOSIS — R002 Palpitations: Secondary | ICD-10-CM | POA: Insufficient documentation

## 2020-02-11 DIAGNOSIS — R000 Tachycardia, unspecified: Secondary | ICD-10-CM | POA: Insufficient documentation

## 2020-02-11 DIAGNOSIS — F419 Anxiety disorder, unspecified: Secondary | ICD-10-CM | POA: Insufficient documentation

## 2020-02-11 DIAGNOSIS — R079 Chest pain, unspecified: Secondary | ICD-10-CM | POA: Insufficient documentation

## 2020-02-11 LAB — CBC
HCT: 44.8 % (ref 39.0–52.0)
Hemoglobin: 15.5 g/dL (ref 13.0–17.0)
MCH: 31.2 pg (ref 26.0–34.0)
MCHC: 34.6 g/dL (ref 30.0–36.0)
MCV: 90.1 fL (ref 80.0–100.0)
Platelets: 245 10*3/uL (ref 150–400)
RBC: 4.97 MIL/uL (ref 4.22–5.81)
RDW: 11.8 % (ref 11.5–15.5)
WBC: 6.1 10*3/uL (ref 4.0–10.5)
nRBC: 0 % (ref 0.0–0.2)

## 2020-02-11 LAB — BASIC METABOLIC PANEL
Anion gap: 10 (ref 5–15)
BUN: 17 mg/dL (ref 6–20)
CO2: 25 mmol/L (ref 22–32)
Calcium: 9.3 mg/dL (ref 8.9–10.3)
Chloride: 103 mmol/L (ref 98–111)
Creatinine, Ser: 0.74 mg/dL (ref 0.61–1.24)
GFR calc Af Amer: >60 mL/min (ref >60)
GFR calc non Af Amer: >60 mL/min (ref >60)
Glucose, Bld: 98 mg/dL (ref 70–99)
Potassium: 4.1 mmol/L (ref 3.5–5.1)
Sodium: 138 mmol/L (ref 135–145)

## 2020-02-11 LAB — TROPONIN I (HIGH SENSITIVITY): Troponin I (High Sensitivity): <2 ng/L (ref <18)

## 2020-02-11 NOTE — Discharge Instructions (Signed)
Your testing today was normal with regards to your heart.  Your x-ray of your lungs looks good there is no pneumonia.  You will need to see your psychiatrist for ongoing evaluation of your anxiety which is likely driving these symptoms.  Return to emergency department for severe or worsening symptoms or any other concerns

## 2020-02-11 NOTE — ED Provider Notes (Signed)
[facility] EMERGENCY DEPARTMENT Provider Note   CSN: [id number] Arrival date & time: 02/11/20  5329     History Chief Complaint  Patient presents with  . Chest Pain  . Tachycardia    Brandon Roberts is a 29 y.o. male.  HPI   This patient is a 29 year old male, he has a history of bipolar disorder, anxiety, also has a history of pseudoseizures and syncopal episodes.  He has never had any specific cause, there is no history of cardiac abnormalities.  He reports that he takes trazodone occasionally for his sleep, he had seen a psychiatrist in the past who gave him this because of his underlying anxiety.  He is currently employed working for [employer], he states that for the last couple of months he has been there, he works 6 or 7 days a week and is feeling very stressed and anxious about that occasionally.  Intermittently over the last couple of weeks he has had palpitations with some chest discomfort.  Paramedics were actually called to his place of employment, told him his EKG looked fine, he did not seek further evaluation at that time.  This morning he had recurrent symptoms, he has actually had them intermittently for the last couple of days.  Chest heaviness, feeling like his heart is racing, he gets a little bit nauseated but no sweaty.  He has no swelling of the legs.  No prior history of exertional cardiac disease.  He has been coughing, he has been taking over-the-counter cough and cold medications including Mucinex.  He thinks Mucinex is making him feel that as well.    Past Medical History:  Diagnosis Date  . ADD (attention deficit disorder)   . Anxiety   . Bipolar disorder (HCC)   . Depression   . Irritable bowel syndrome (IBS)   . Pseudoseizure   . Syncopal episodes    being monitored for cardiac causation    Patient Active Problem List   Diagnosis Date Noted  . Bipolar affective disorder (HCC) 01/17/2012    Class: Acute  . SPASM OF MUSCLE 08/17/2010  . ANXIETY  01/10/2010  . DEPRESSION 01/10/2010  . ALLERGIC RHINITIS 01/10/2010  . SHOULDER PAIN, BILATERAL 01/10/2010  . INSOMNIA-SLEEP DISORDER-UNSPEC 01/10/2010  . SYNCOPE 04/26/2009    Past Surgical History:  Procedure Laterality Date  . heart monitor implant    . SHOULDER SURGERY         Family History  Problem Relation Age of Onset  . Hypertension Mother   . Migraines Mother   . Drug abuse Mother   . Drug abuse Father   . Drug abuse Paternal Grandfather     Social History   Tobacco Use  . Smoking status: Never Smoker  . Smokeless tobacco: Never Used  Substance Use Topics  . Alcohol use: Yes    Comment: rarely  . Drug use: Yes    Types: Marijuana    Home Medications Prior to Admission medications   Medication Sig Start Date End Date Taking? Authorizing Provider  ALPRAZolam Prudy Feeler) 1 MG tablet Take 1 mg by mouth 3 (three) times daily as needed. 10/15/19   [provider]  dicyclomine (BENTYL) 20 MG tablet Take 20 mg by mouth every 6 (six) hours as needed for spasms.    [provider]  FLUoxetine (PROZAC) 20 MG capsule Take 20 mg by mouth daily. 10/13/19   [provider]  gabapentin (NEURONTIN) 100 MG capsule Take 100 mg by mouth at bedtime. 12/09/19  [provider]  indomethacin (INDOCIN) 25 MG capsule Take 1 capsule (25 mg total) by mouth 3 (three) times daily. 12/11/19   Dione Booze, MD  meloxicam (MOBIC) 15 MG tablet Take 1 tablet (15 mg total) by mouth daily. 10/22/19   Lenn Sink, DPM  predniSONE (DELTASONE) 10 MG tablet 12 day tapering dose 12/24/19   Lenn Sink, DPM  traZODone (DESYREL) 50 MG tablet  12/09/19   [provider]    Allergies    Patient has no known allergies.  Review of Systems   Review of Systems  All other systems reviewed and are negative.   Physical Exam Updated Vital Signs BP 122/77 (BP Location: Left Arm)   Pulse 91   Temp 98.1 F (36.7 C) (Oral)   Resp 18   SpO2 99%   Physical  Exam Vitals and nursing note reviewed.  Constitutional:      General: He is not in acute distress.    Appearance: He is well-developed.  HENT:     Head: Normocephalic and atraumatic.     Mouth/Throat:     Pharynx: No oropharyngeal exudate.  Eyes:     General: No scleral icterus.       Right eye: No discharge.        Left eye: No discharge.     Conjunctiva/sclera: Conjunctivae normal.     Pupils: Pupils are equal, round, and reactive to light.  Neck:     Thyroid: No thyromegaly.     Vascular: No JVD.  Cardiovascular:     Rate and Rhythm: Regular rhythm. Tachycardia present.     Heart sounds: Normal heart sounds. No murmur. No friction rub. No gallop.      Comments: Pulse 105, normal radial artery pulses. Pulmonary:     Effort: Pulmonary effort is normal. No respiratory distress.     Breath sounds: Normal breath sounds. No wheezing or rales.  Abdominal:     General: Bowel sounds are normal. There is no distension.     Palpations: Abdomen is soft. There is no mass.     Tenderness: There is no abdominal tenderness.  Musculoskeletal:        General: No tenderness. Normal range of motion.     Cervical back: Normal range of motion and neck supple.  Lymphadenopathy:     Cervical: No cervical adenopathy.  Skin:    General: Skin is warm and dry.     Findings: No erythema or rash.  Neurological:     Mental Status: He is alert.     Coordination: Coordination normal.  Psychiatric:     Comments: Anxious appearing     ED Results / Procedures / Treatments   Labs (all labs ordered are listed, but only abnormal results are displayed) Labs Reviewed  CBC  BASIC METABOLIC PANEL  TROPONIN I (HIGH SENSITIVITY)    EKG EKG Interpretation  Date/Time:  Wednesday February 11 2020 07:24:23 EDT Ventricular Rate:  113 PR Interval:    QRS Duration: 72 QT Interval:  321 QTC Calculation: 441 R Axis:   23 Text Interpretation: Sinus tachycardia Baseline wander in lead(s) V2 Since last tracing  rate faster Confirmed by Eber Hong ([id number]) on 02/11/2020 7:37:51 AM   Radiology DG Chest 2 View  Result Date: 02/11/2020 CLINICAL DATA:  Cough, congestion and chest pain for 2 weeks, smoker, negative for COVID-19 last week EXAM: CHEST - 2 VIEW COMPARISON:  07/27/2019 FINDINGS: Normal heart size, mediastinal contours, and pulmonary vascularity. Mild peribronchial thickening.  No pulmonary infiltrate, pleural effusion, or pneumothorax. Bones unremarkable. IMPRESSION: Mild bronchitic changes without infiltrate. Electronically Signed   By: Lavonia Dana M.D.   On: 02/11/2020 08:21    Procedures Procedures (including critical care time)  Medications Ordered in ED Medications - No data to display  ED Course  I have reviewed the triage vital signs and the nursing notes.  Pertinent labs & imaging results that were available during my care of the patient were reviewed by me and considered in my medical decision making (see chart for details).    MDM Rules/Calculators/A&P                      This patient has some chest discomfort likely associated with palpitations which I suspect is related to underlying anxiety.  The patient smells of marijuana, endorses using frequently, denies alcohol use or other tobacco use.  He denies other drug use, he seems to be anxious and has an underlying bipolar disorder which is not aggressively being treated.  He does not appear to be responding to internal stimuli, he is not suicidal or depressed in appearance.  We will check electrolytes, a single troponin and a chest x-ray to make sure there is no underlying infection.  The patient is agreeable to the plan, he is well-appearing, normotensive, oxygen of 99% and a temperature of 98.1.  Anticipate discharge.  I have personally looked at the x-ray, there is no signs of infiltrate pneumothorax or abnormal mediastinum.  I reviewed the labs showing a negative troponin which is undetectable, white blood cell count of  6000, no anemia and normal metabolic panel.  The EKG is unremarkable and shows no signs of ischemia, just mild tachycardia which I suspect is anxiety related.  His heart rate fluctuates from the 90s to the 115 range in the room with regards to his appearance of anxiety.  At this time he appears stable for discharge    Final Clinical Impression(s) / ED Diagnoses Final diagnoses:  Chest pain, non-cardiac  Palpitations  Anxiety    Rx / DC Orders ED Discharge Orders    None       Noemi Chapel, MD 02/11/20 [phone]

## 2020-02-11 NOTE — ED Triage Notes (Signed)
Pt reports has had a cold for the past couple of weeks and has been taking mucinex.  Reports had a negative covid test last week.  Pt says for the past few days he has been having cp and feeling like heart racing.

## 2020-09-11 IMAGING — US ULTRASOUND ABDOMEN COMPLETE
1 series · 14 of 25 positions shown · non-contrast
Comparison: None.

CLINICAL DATA: Epigastric pain

EXAM:
ABDOMEN ULTRASOUND COMPLETE

[Series 1: ultrasound abdomen complete · 14 of 106 slices shown]
[im 1/106]
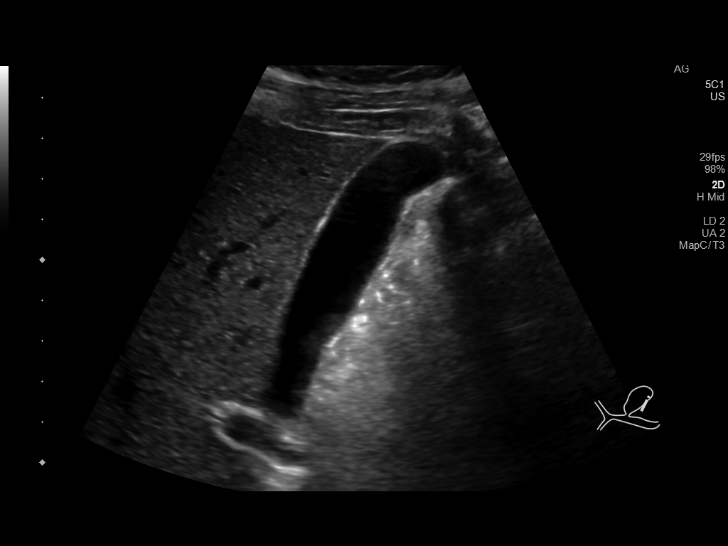
[im 9/106]
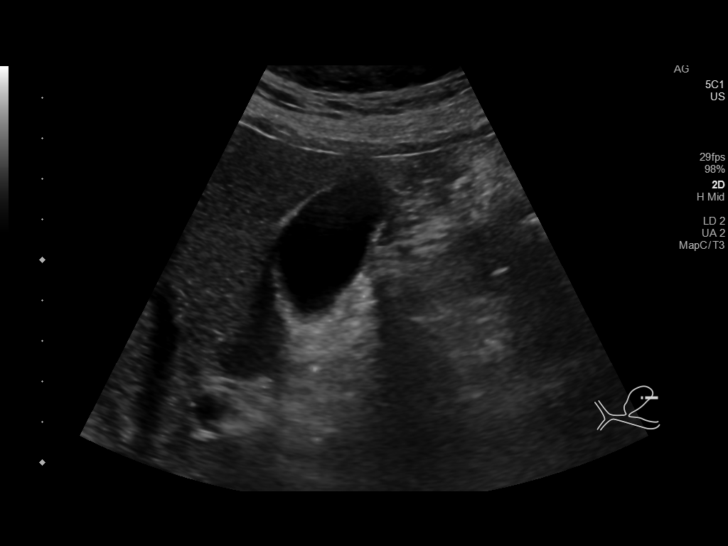
[im 18/106]
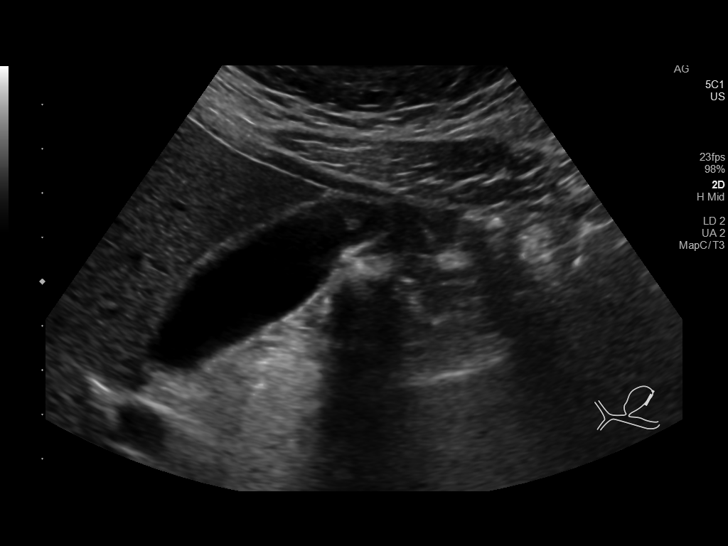
[im 27/106]
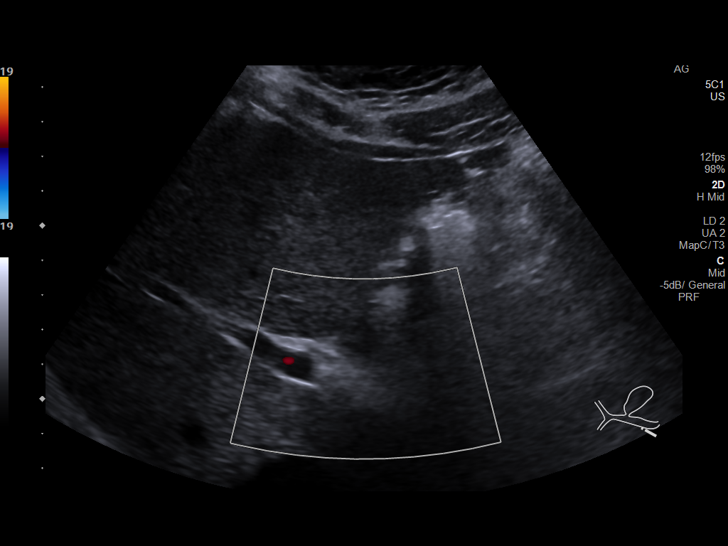
[im 36/106]
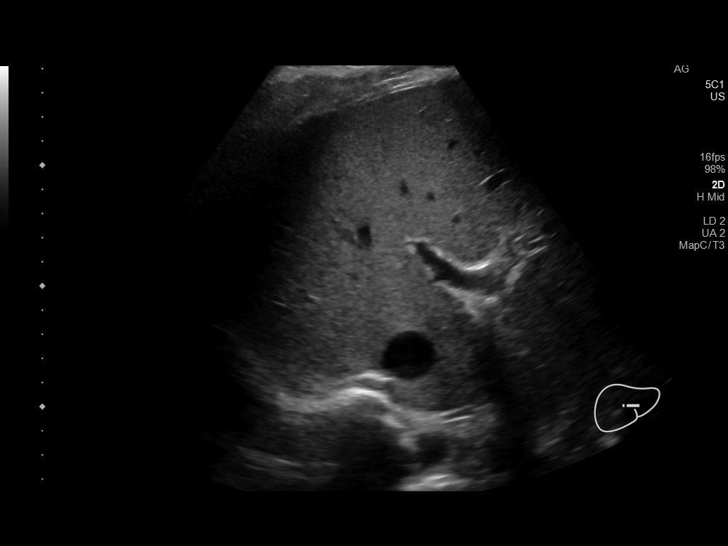
[im 40/106]
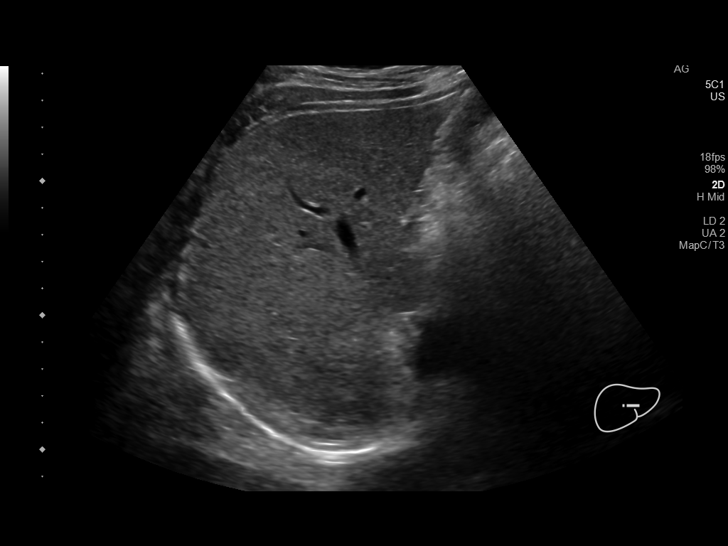
[im 49/106]
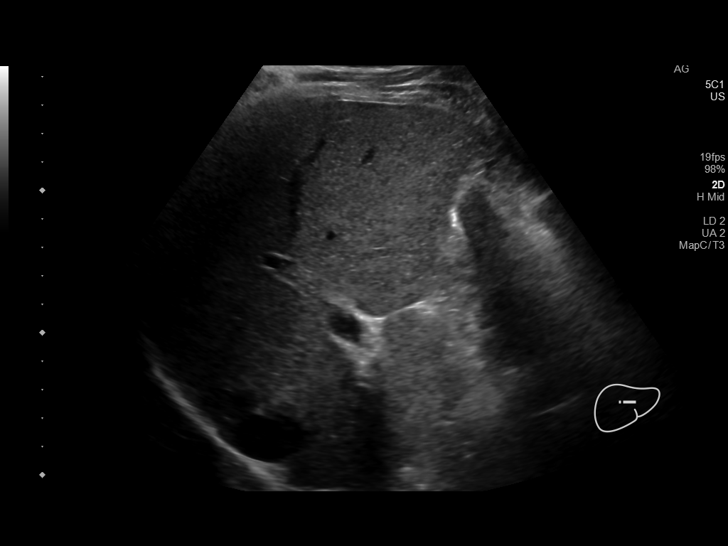
[im 57/106]
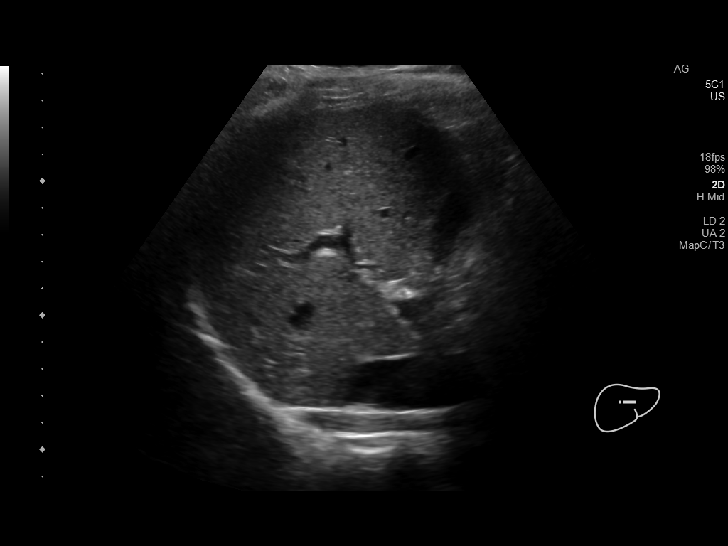
[im 66/106]
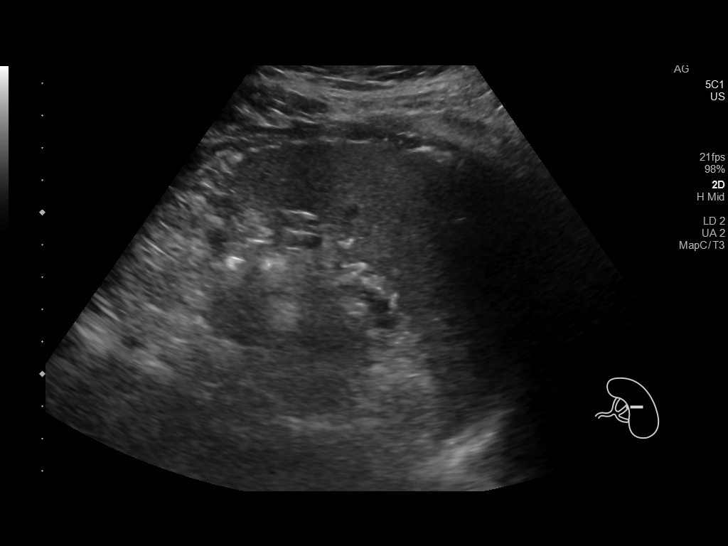
[im 71/106]
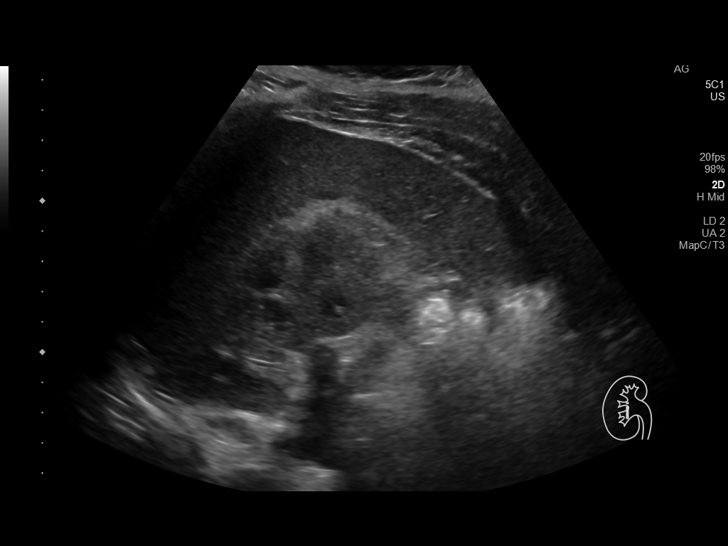
[im 79/106]
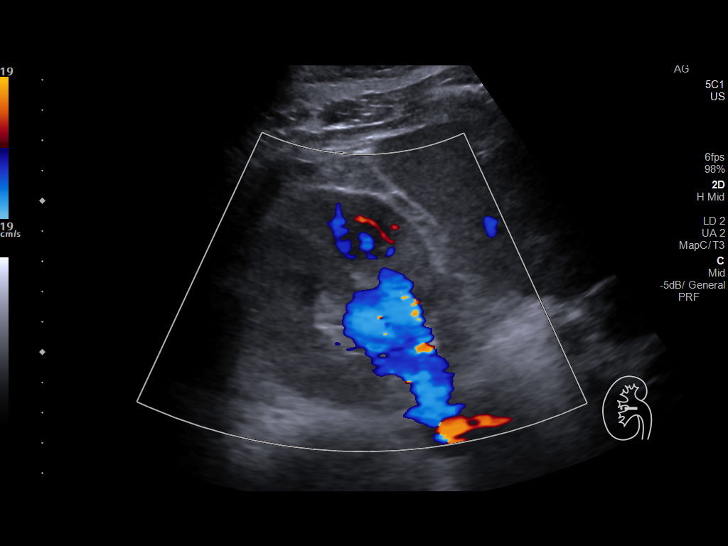
[im 88/106]
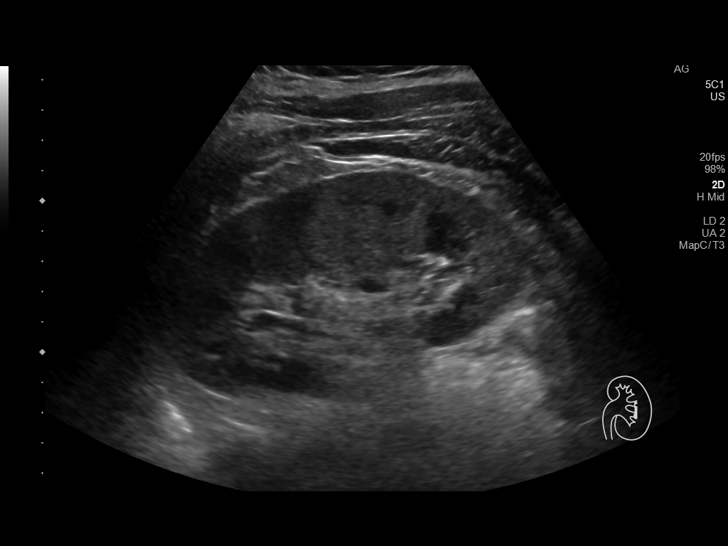
[im 97/106]
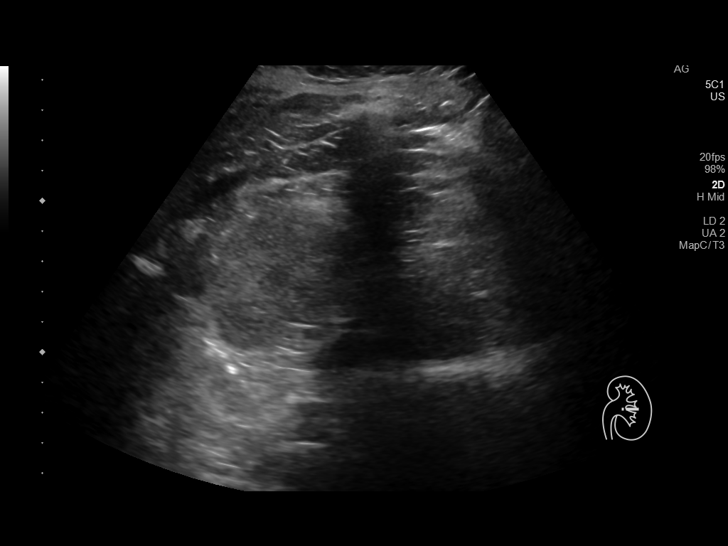
[im 106/106]
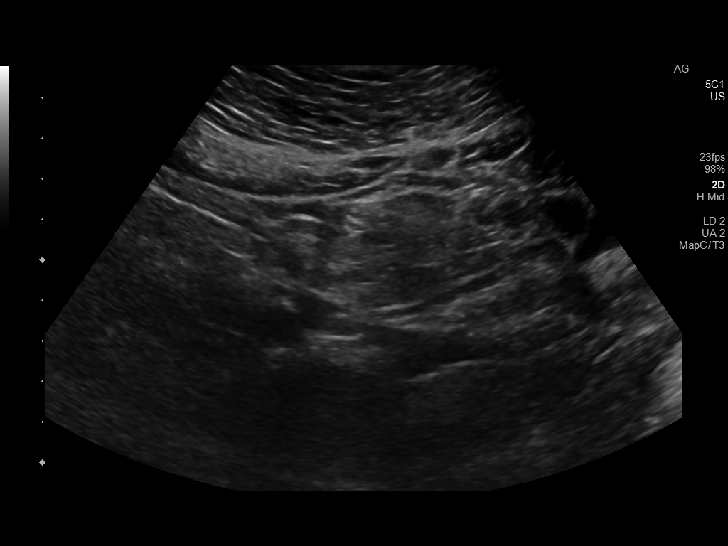

[14 of 25 positions shown; findings below may reference images not displayed]

FINDINGS: Gallbladder: Negative for gallstones, wall thickening,
pericholecystic fluid, or Murphy's sign. Wall thickness is 1.7 mm.
Incidental polyp in the fundus measures 3 mm.

Common bile duct: Diameter: 2 mm

Liver: No focal lesion identified. Within normal limits in
parenchymal echogenicity. Portal vein is patent on color Doppler
imaging with normal direction of blood flow towards the liver.

IVC: No abnormality visualized.

Pancreas: Visualized portion unremarkable.

Spleen: Size and appearance within normal limits.

Right Kidney: Length: 10.8 cm. Echogenicity within normal limits. No
mass or hydronephrosis visualized.

Left Kidney: Length: 12.2 cm. Echogenicity within normal limits. No
mass or hydronephrosis visualized.

Abdominal aorta: No aneurysm visualized.

Other findings: No free fluid or ascites
IMPRESSION: Incidental gallbladder polyp. Negative for gallstones or biliary
dilatation.

No other acute finding by ultrasound.

## 2021-07-05 ENCOUNTER — Other Ambulatory Visit: Payer: Self-pay

## 2021-07-05 ENCOUNTER — Encounter (HOSPITAL_COMMUNITY): Payer: Self-pay

## 2021-07-05 ENCOUNTER — Emergency Department (HOSPITAL_COMMUNITY): Payer: Self-pay

## 2021-07-05 ENCOUNTER — Emergency Department (HOSPITAL_COMMUNITY)
Admission: EM | Admit: 2021-07-05 | Discharge: 2021-07-05 | Disposition: A | Discharge status: Home / Self Care | Payer: Self-pay | Attending: Emergency Medicine | Admitting: Emergency Medicine

## 2021-07-05 DIAGNOSIS — R42 Dizziness and giddiness: Secondary | ICD-10-CM | POA: Insufficient documentation

## 2021-07-05 DIAGNOSIS — Z79899 Other long term (current) drug therapy: Secondary | ICD-10-CM | POA: Insufficient documentation

## 2021-07-05 DIAGNOSIS — R0602 Shortness of breath: Secondary | ICD-10-CM | POA: Insufficient documentation

## 2021-07-05 DIAGNOSIS — R002 Palpitations: Secondary | ICD-10-CM | POA: Insufficient documentation

## 2021-07-05 LAB — CBC WITH DIFFERENTIAL/PLATELET
Abs Immature Granulocytes: 0.01 10*3/uL (ref 0.00–0.07)
Basophils Absolute: 0 10*3/uL (ref 0.0–0.1)
Basophils Relative: 1 %
Eosinophils Absolute: 0.1 10*3/uL (ref 0.0–0.5)
Eosinophils Relative: 2 %
HCT: 47 % (ref 39.0–52.0)
Hemoglobin: 16.1 g/dL (ref 13.0–17.0)
Immature Granulocytes: 0 %
Lymphocytes Relative: 44 %
Lymphs Abs: 2.6 10*3/uL (ref 0.7–4.0)
MCH: 31.9 pg (ref 26.0–34.0)
MCHC: 34.3 g/dL (ref 30.0–36.0)
MCV: 93.1 fL (ref 80.0–100.0)
Monocytes Absolute: 0.5 10*3/uL (ref 0.1–1.0)
Monocytes Relative: 9 %
Neutro Abs: 2.6 10*3/uL (ref 1.7–7.7)
Neutrophils Relative %: 44 %
Platelets: 236 10*3/uL (ref 150–400)
RBC: 5.05 MIL/uL (ref 4.22–5.81)
RDW: 12.4 % (ref 11.5–15.5)
WBC: 5.9 10*3/uL (ref 4.0–10.5)
nRBC: 0 % (ref 0.0–0.2)

## 2021-07-05 LAB — BASIC METABOLIC PANEL
Anion gap: 6 (ref 5–15)
BUN: 16 mg/dL (ref 6–20)
CO2: 27 mmol/L (ref 22–32)
Calcium: 9.6 mg/dL (ref 8.9–10.3)
Chloride: 105 mmol/L (ref 98–111)
Creatinine, Ser: 0.72 mg/dL (ref 0.61–1.24)
GFR, Estimated: >60 mL/min (ref >60)
Glucose, Bld: 88 mg/dL (ref 70–99)
Potassium: 5.2 mmol/L — ABNORMAL HIGH (ref 3.5–5.1)
Sodium: 138 mmol/L (ref 135–145)

## 2021-07-05 LAB — TROPONIN I (HIGH SENSITIVITY): Troponin I (High Sensitivity): <2 ng/L (ref <18)

## 2021-07-05 NOTE — Discharge Instructions (Addendum)
Your work-up is reassuring today.  It does not appear that you had a heart attack.  Your chest x-ray is normal.  I think the pain in your back is musculoskeletal.  I think it is important that you follow-up with a cardiologist about your symptoms.  It is possible that you are having abnormal heart beats that contribute to your dizziness and shortness of breath.  I am attaching a doctor to these papers, please call them within the week to schedule an appointment.  Information about shortness of breath is also attached to these discharge papers.  It was a pleasure to meet you and I hope that you feel better.

## 2021-07-05 NOTE — ED Provider Notes (Addendum)
[facility] EMERGENCY DEPARTMENT Provider Note   CSN: [id number] Arrival date & time: 07/05/21  7425     History Chief Complaint  Patient presents with   Shortness of Breath    Brandon Roberts is a 30 y.o. male with a past medical history of adolescent syncope, psychogenic seizure and IBS presenting today with a complaint of subscapular pain, dizziness and shortness of breath.  Patient reports that this pain under his scapula has been going on for 3 days.  Over the last 2 weeks he has had dizzy spells with some shortness of breath.  No history of cardiac arrhythmias.  Denies any changes in medication, recent illness or chest pain.  He says that he will occasionally feel his heart race and at the same time become dizzy and short of breath.  Denies syncope.  Nothing seems to make his discomfort worse or better, symptoms are random.  Denies drug or alcohol use.   Past Medical History:  Diagnosis Date   ADD (attention deficit disorder)    Anxiety    Bipolar disorder (HCC)    Depression    Irritable bowel syndrome (IBS)    Pseudoseizure    Syncopal episodes    being monitored for cardiac causation    Patient Active Problem List   Diagnosis Date Noted   Bipolar affective disorder (HCC) 01/17/2012    Class: Acute   SPASM OF MUSCLE 08/17/2010   ANXIETY 01/10/2010   DEPRESSION 01/10/2010   ALLERGIC RHINITIS 01/10/2010   SHOULDER PAIN, BILATERAL 01/10/2010   INSOMNIA-SLEEP DISORDER-UNSPEC 01/10/2010   SYNCOPE 04/26/2009    Past Surgical History:  Procedure Laterality Date   heart monitor implant     SHOULDER SURGERY         Family History  Problem Relation Age of Onset   Hypertension Mother    Migraines Mother    Drug abuse Mother    Drug abuse Father    Drug abuse Paternal Grandfather     Social History   Tobacco Use   Smoking status: Never   Smokeless tobacco: Never  Substance Use Topics   Alcohol use: Not Currently    Comment: rarely   Drug use: Not  Currently    Types: Marijuana    Home Medications Prior to Admission medications   Medication Sig Start Date End Date Taking? Authorizing Provider  ALPRAZolam Prudy Feeler) 1 MG tablet Take 1 mg by mouth 3 (three) times daily as needed. 10/15/19   [provider]  dicyclomine (BENTYL) 20 MG tablet Take 20 mg by mouth every 6 (six) hours as needed for spasms.    [provider]  FLUoxetine (PROZAC) 20 MG capsule Take 20 mg by mouth daily. 10/13/19   [provider]  gabapentin (NEURONTIN) 100 MG capsule Take 100 mg by mouth at bedtime. 12/09/19   [provider]  indomethacin (INDOCIN) 25 MG capsule Take 1 capsule (25 mg total) by mouth 3 (three) times daily. 12/11/19   Dione Booze, MD  meloxicam (MOBIC) 15 MG tablet Take 1 tablet (15 mg total) by mouth daily. 10/22/19   Lenn Sink, DPM  predniSONE (DELTASONE) 10 MG tablet 12 day tapering dose 12/24/19   Lenn Sink, DPM  traZODone (DESYREL) 50 MG tablet  12/09/19   [provider]    Allergies    Patient has no known allergies.  Review of Systems   Review of Systems  Constitutional:  Negative for chills and fever.  Respiratory:  Positive for shortness  of breath.   Cardiovascular:  Positive for palpitations. Negative for chest pain and leg swelling.  Gastrointestinal:  Negative for abdominal pain.  Neurological:  Positive for dizziness.  Psychiatric/Behavioral:  The patient is nervous/anxious.   All other systems reviewed and are negative.  Physical Exam Updated Vital Signs BP 109/71   Pulse 61   Temp 98.7 F (37.1 C) (Oral)   Resp (!) 23   Ht 5\' 6"  (1.676 m)   Wt 74.8 kg   SpO2 97%   BMI 26.63 kg/m   Physical Exam Vitals and nursing note reviewed.  Constitutional:      General: He is not in acute distress.    Appearance: Normal appearance. He is not diaphoretic.  HENT:     Head: Normocephalic and atraumatic.     Mouth/Throat:     Mouth: Mucous membranes are moist.     Pharynx:  Oropharynx is clear.  Eyes:     General: No scleral icterus.    Conjunctiva/sclera: Conjunctivae normal.  Cardiovascular:     Rate and Rhythm: Normal rate and regular rhythm. No extrasystoles are present.    Heart sounds: No murmur heard. Pulmonary:     Effort: Pulmonary effort is normal. No respiratory distress.     Breath sounds: Normal breath sounds. No wheezing, rhonchi or rales.  Chest:     Chest wall: No tenderness.  Abdominal:     Palpations: Abdomen is soft.     Tenderness: There is no abdominal tenderness.  Musculoskeletal:     Cervical back: Normal range of motion and neck supple.     Comments: Full range of motion of bilateral upper extremities and spine.  Mild TTP over right scapula and shoulder.  No crepitus.  Skin:    General: Skin is warm and dry.     Findings: No rash.  Neurological:     Mental Status: He is alert.  Psychiatric:        Mood and Affect: Mood normal.    ED Results / Procedures / Treatments   Labs (all labs ordered are listed, but only abnormal results are displayed) Labs Reviewed  BASIC METABOLIC PANEL  CBC WITH DIFFERENTIAL/PLATELET  TROPONIN I (HIGH SENSITIVITY)    EKG EKG Interpretation  Date/Time:  Tuesday July 05 2021 09:49:37 EDT Ventricular Rate:  64 PR Interval:  148 QRS Duration: 80 QT Interval:  376 QTC Calculation: 388 R Axis:   12 Text Interpretation: Sinus rhythm Low voltage, precordial leads ST elev, probable normal early repol pattern Confirmed by [birth year] [phone]) on 07/05/2021 9:54:36 AM  Radiology DG Chest Port 1 View  Result Date: 07/05/2021 CLINICAL DATA:  Pt c/o intermittent dizziness, feeling like heart is racing, and SOB for the past 2 weeks. Reports pain in center of back that moved under Rt shoulder blade. Hx nonsmoker cough, sob EXAM: PORTABLE CHEST 1 VIEW COMPARISON:  None. FINDINGS: Normal mediastinum and cardiac silhouette. Normal pulmonary vasculature. No evidence of effusion, infiltrate, or  pneumothorax. No acute bony abnormality. IMPRESSION: No acute cardiopulmonary process. Electronically Signed   By: 13/09/2020 M.D.   On: 07/05/2021 10:13    Procedures Procedures   Medications Ordered in ED Medications - No data to display  ED Course  I have reviewed the triage vital signs and the nursing notes.  Pertinent labs & imaging results that were available during my care of the patient were reviewed by me and considered in my medical decision making (see chart for details).    MDM  Rules/Calculators/A&P The emergent differential diagnosis for shortness of breath includes, but is not limited to, Pulmonary edema, bronchoconstriction, Pneumonia, Pulmonary embolism, Pneumotherax/ Hemothorax, Dysrythmia, ACS.  All were considered throughout the evaluation of this patient.  Kahlin was evaluated and in no acute distress.  Resting comfortably on the exam stretcher.  He says that he was most concerned about his shoulder pain and was unsure if it was caused by his kidneys or his lungs.  Chest x-ray was negative.  EKG without signs of ACS.  Troponin negative. Lung sounds are clear.  Low suspicion for pulmonary embolus and PERC negative.I believe his scapular pain to be musculoskeletal in nature.  He has a history of chronic bilateral shoulder pain.  However because he has been having these occasions of palpitations, dizziness or shortness of breath I would like him to follow-up with a cardiologist to be further evaluated for a cardiac arrhythmia.  His potassium was 5.2, asymptomatic of this and no peaked T waves on EKG. very low suspicion for dissection.  Hemodynamically stable.  He is without risk factors of this and remains hemodynamically stable over the hours of his stay.  At this time he is a strong candidate for discharge at this time.  He understands the importance of following up with a cardiologist because we are unable to identify any arrhythmias in the department today.  Final  Clinical Impression(s) / ED Diagnoses Final diagnoses:  SOB (shortness of breath)  Dizziness  Palpitations    Rx / DC Orders Results and diagnoses were explained to the patient. Return precautions discussed in full. Patient had no additional questions and expressed complete understanding.     Saddie Benders, PA-C 07/05/21 1240    Vanetta Mulders, MD 07/06/21 [phone]

## 2021-07-05 NOTE — ED Triage Notes (Signed)
Pt reports having intermittent dizziness, feeling like heart is racing, and sob for the past 2 weeks. Reports pain in center of back that moved under r shoulder blade.  Also reports cough that started yesterday.  Denies any fever.

## 2022-03-18 ENCOUNTER — Other Ambulatory Visit: Payer: Self-pay

## 2022-03-18 ENCOUNTER — Encounter (HOSPITAL_COMMUNITY): Payer: Self-pay | Admitting: Emergency Medicine

## 2022-03-18 ENCOUNTER — Emergency Department (HOSPITAL_COMMUNITY)
Admission: EM | Admit: 2022-03-18 | Discharge: 2022-03-18 | Disposition: A | Discharge status: Home / Self Care | Payer: Self-pay | Attending: Emergency Medicine | Admitting: Emergency Medicine

## 2022-03-18 DIAGNOSIS — R1013 Epigastric pain: Secondary | ICD-10-CM

## 2022-03-18 DIAGNOSIS — K219 Gastro-esophageal reflux disease without esophagitis: Secondary | ICD-10-CM | POA: Insufficient documentation

## 2022-03-18 LAB — CBC WITH DIFFERENTIAL/PLATELET
Abs Immature Granulocytes: 0.02 10*3/uL (ref 0.00–0.07)
Basophils Absolute: 0 10*3/uL (ref 0.0–0.1)
Basophils Relative: 0 %
Eosinophils Absolute: 0.1 10*3/uL (ref 0.0–0.5)
Eosinophils Relative: 2 %
HCT: 46.3 % (ref 39.0–52.0)
Hemoglobin: 15.9 g/dL (ref 13.0–17.0)
Immature Granulocytes: 0 %
Lymphocytes Relative: 35 %
Lymphs Abs: 2.6 10*3/uL (ref 0.7–4.0)
MCH: 31.4 pg (ref 26.0–34.0)
MCHC: 34.3 g/dL (ref 30.0–36.0)
MCV: 91.3 fL (ref 80.0–100.0)
Monocytes Absolute: 0.5 10*3/uL (ref 0.1–1.0)
Monocytes Relative: 7 %
Neutro Abs: 4.1 10*3/uL (ref 1.7–7.7)
Neutrophils Relative %: 56 %
Platelets: 231 10*3/uL (ref 150–400)
RBC: 5.07 MIL/uL (ref 4.22–5.81)
RDW: 12 % (ref 11.5–15.5)
WBC: 7.3 10*3/uL (ref 4.0–10.5)
nRBC: 0 % (ref 0.0–0.2)

## 2022-03-18 LAB — COMPREHENSIVE METABOLIC PANEL
ALT: 34 U/L (ref 0–44)
AST: 23 U/L (ref 15–41)
Albumin: 4.3 g/dL (ref 3.5–5.0)
Alkaline Phosphatase: 103 U/L (ref 38–126)
Anion gap: 7 (ref 5–15)
BUN: 21 mg/dL — ABNORMAL HIGH (ref 6–20)
CO2: 27 mmol/L (ref 22–32)
Calcium: 9.4 mg/dL (ref 8.9–10.3)
Chloride: 102 mmol/L (ref 98–111)
Creatinine, Ser: 0.89 mg/dL (ref 0.61–1.24)
GFR, Estimated: >60 mL/min (ref >60)
Glucose, Bld: 99 mg/dL (ref 70–99)
Potassium: 4.3 mmol/L (ref 3.5–5.1)
Sodium: 136 mmol/L (ref 135–145)
Total Bilirubin: 0.7 mg/dL (ref 0.3–1.2)
Total Protein: 7.5 g/dL (ref 6.5–8.1)

## 2022-03-18 LAB — URINALYSIS, ROUTINE W REFLEX MICROSCOPIC
Bilirubin Urine: NEGATIVE
Glucose, UA: NEGATIVE mg/dL
Hgb urine dipstick: NEGATIVE
Ketones, ur: NEGATIVE mg/dL
Leukocytes,Ua: NEGATIVE
Nitrite: NEGATIVE
Protein, ur: NEGATIVE mg/dL
Specific Gravity, Urine: 1.023 (ref 1.005–1.030)
pH: 6 (ref 5.0–8.0)

## 2022-03-18 LAB — LIPASE, BLOOD: Lipase: 70 U/L — ABNORMAL HIGH (ref 11–51)

## 2022-03-18 MED ORDER — PANTOPRAZOLE SODIUM 40 MG PO TBEC: 40.0000 mg | DELAYED_RELEASE_TABLET | Freq: Every day | ORAL | 0 refills | Status: AC

## 2022-03-18 NOTE — Discharge Instructions (Addendum)
A prescription by the name of Protonix has been sent to your pharmacy.  Please take 1 tablet daily for the next 3 weeks.  Further information of GERD and appropriate food choices has been provided for you to review.  Continue with PCP follow-up within the next 2 to 3 days for reevaluation and continued medical management.  Return to the ED for new or worsening symptoms as discussed.  If you do not have a doctor see the list below.  RESOURCE GUIDE  Chronic Pain Problems: Contact [facility]  [phone] Patients need to be referred by their primary care doctor.  Insufficient Money for Medicine: Contact United Way:  call "211" or [place] [phone].  No Primary Care Doctor: Call [facility]  [phone] - can help you locate a primary care doctor that  accepts your insurance, provides certain services, etc. Physician Referral Service- [phone] Agencies that provide inexpensive medical care: [facility]  [facility]  [phone] [facility]  [phone] [facility]  [phone] [facility]  [phone] [facility]  [phone]  [place] Providers: [facility]- [address], [address]  [phone], Mon-Fri 9am-7pm, Sat 9am-1pm [place], [address], [address] [place]  [place]- [address]  [place], [address], [phone]  Only accepts [place] patients after they have their name  applied to their card  Self Pay (no insurance) in [facility]: Sickle Cell Patients: Dr Kevan Ny, [facility]  [place], [facility]- [place]  [place] [place]- [id number] [place], [facility]- see information above (Speak to [employer] if you do not have insurance)       -  [facility]- [place], [place],  [place], [place]  Dr Vista Lawman-  [address], [place], [facility]- [address] [phone]       -  [facility]- [address], [place], also [address] [id number]       -    [facility]- [address], [place], 1st & 3rd Saturday   every month, 10am-1pm  1) Find a Doctor and Pay Out of Pocket Although you won't have to find out who is covered by your insurance plan, it is a good idea to ask around and get recommendations. You will then need to call the office and see if the doctor you have chosen will accept you as a new patient and what types of options they offer for patients who are self-pay. Some doctors offer discounts or will set up payment plans for their patients who do not have insurance, but you will need to ask so you aren't surprised when you get to your appointment.  2) Contact Your  Local Health Department Not all health departments have doctors that can see patients for sick visits, but many do, so it is worth a call to see if yours does. If you don't know where your local health department is, you can check in your phone book. The CDC also has a tool to help you locate your state's health department, and many state websites also have listings of all of their local health departments.  3) Find a Walk-in Clinic If your illness is not likely to be very severe or complicated, you may want to try a walk in clinic. These are popping up all over the country in pharmacies, drugstores, and shopping centers. They're usually staffed by nurse practitioners or physician assistants that have been trained to treat  common illnesses and complaints. They're usually fairly quick and inexpensive. However, if you have serious medical issues or chronic medical problems, these are probably not your best option  STD Testing [facility] Department of [facility], STD Clinic, [address] [place], phone [phone] or [phone].  Monday - Friday, call for an appointment. [facility] Department of [employer], STD Clinic, [place] [address], [place], phone [phone] or [phone].  Monday - Friday, call for an appointment.  Abuse/Neglect: [facility] Child Abuse Hotline 203-473-3276 [facility] Child Abuse Hotline 204-381-6295 (After Hours)  Emergency Shelter:  [facility] 4081125731  Maternity Homes: Room at the [place] [facility] (561) [phone] [facility] ([phone]  MRSA Hotline #:   213 782 0391  [facility] Resources  [facility] [place]     United Way                          [facility] Dept. [address] [place]                       [address]      [address] [place] [address]  Blondell Reveal Phone:  [phone]                                   Phone:  [phone]                 Phone:  [phone]  [facility], [phone] [facility] [place]- [phone]       -     [facility] in [place], [address]                                  [phone], [facility] Child Abuse Hotline (762) 693-4332 or 671-609-4981 (After Hours)  Behavioral Health Services  Substance Abuse Resources: [facility] Drug [facility]  [phone] [facility] (801)547-3678 [facility] [phone] Floydene Flock (404) [phone] Residential & Outpatient Substance  Abuse Program   7157832903  Psychological Services: [facility] Health  [phone] [facility]  [phone] [facility], [phone] [place]. [address] [place], ACCESS LINE: [phone] or [phone], [employer]ur[employer]an.co.za  Dental Assistance  Patients with Medicaid: [facility] (404)[phone] [address]                                           517-636-5778 [address] [employer] Phone:  [phone]                                                  Phone:  [phone]  If unable to pay or uninsured, contact:  [facility] or [facility] to become qualified for the adult dental clinic.  Patients with Medicaid: [facility] [phone] [address], [phone] [address], [phone]  If unable to pay, or uninsured, contact [facility] [phone]) or [facility] Department [phone] in [place], [phone] in [facility]) to become qualified for the adult dental clinic  Other Low-Cost Community Dental Services: [facility]- [address] [place], [place], [place], [address], [phone], [address]. [phone], 2nd and 4th Thursday of the month at 6:30am.  10 clients each day by appointment, can sometimes see walk-in patients if someone does not show for an appointment. [facility]- [address] [place], [place], [address], [phone] [facility] [address] [place], [place], [address], [phone] [facility] Health Department- [phone] [facility] Health Department- [phone] [facility] [phone]

## 2022-03-18 NOTE — ED Provider Notes (Signed)
[facility] EMERGENCY DEPARTMENT Provider Note   CSN: [id number] Arrival date & time: 03/18/22  1054     History  Chief Complaint  Patient presents with   Abdominal Pain    Brandon Roberts is a 31 y.o. male with history of anxiety, depression, IBS, bipolar affective disorder, GERD, ADD, and pseudoseizures.  Presenting today due to upper abdominal/lower chest discomfort.  Has been waxing and waning since Wednesday.  Described as dull ache, without radiation.  Appears to be worsened with swallowing.  Felt some relief with Tums and Pepcid.  Denies N/V/D, fevers, chills, urinary symptoms, changes in bowel habits, headaches, or vision changes.  Not worsened by palpation or range of motion.  No recent injuries.  Not on any GERD maintenance medications.  Denies active discomfort or symptoms at this time.  The history is provided by the patient and medical records.  Abdominal Pain      Home Medications Prior to Admission medications   Medication Sig Start Date End Date Taking? Authorizing Provider  pantoprazole (PROTONIX) 40 MG tablet Take 1 tablet (40 mg total) by mouth daily for 21 days. 03/18/22 04/08/22 Yes Cecil Cobbs, PA-C  ALPRAZolam Prudy Feeler) 1 MG tablet Take 1 mg by mouth 3 (three) times daily as needed. Patient not taking: No sig reported 10/15/19   [provider]  dicyclomine (BENTYL) 20 MG tablet Take 20 mg by mouth every 6 (six) hours as needed for spasms. Patient not taking: Reported on 07/05/2021    [provider]  FLUoxetine (PROZAC) 20 MG capsule Take 20 mg by mouth daily. Patient not taking: No sig reported 10/13/19   [provider]  gabapentin (NEURONTIN) 100 MG capsule Take 100 mg by mouth at bedtime. Patient not taking: No sig reported 12/09/19   [provider]  indomethacin (INDOCIN) 25 MG capsule Take 1 capsule (25 mg total) by mouth 3 (three) times daily. Patient not taking: No sig reported 12/11/19   Dione Booze, MD  meloxicam  (MOBIC) 15 MG tablet Take 1 tablet (15 mg total) by mouth daily. Patient not taking: No sig reported 10/22/19   Lenn Sink, DPM  predniSONE (DELTASONE) 10 MG tablet 12 day tapering dose Patient not taking: No sig reported 12/24/19   Lenn Sink, DPM      Allergies    Patient has no known allergies.    Review of Systems   Review of Systems  Gastrointestinal:  Positive for abdominal pain.    Physical Exam Updated Vital Signs BP 125/85 (BP Location: Left Arm)   Pulse 69   Temp 98.2 F (36.8 C) (Oral)   Resp 19   Ht 5\' 6"  (1.676 m)   Wt 83.9 kg   SpO2 98%   BMI 29.86 kg/m  Physical Exam Vitals and nursing note reviewed.  Constitutional:      General: He is not in acute distress.    Appearance: He is well-developed. He is not ill-appearing, toxic-appearing or diaphoretic.  HENT:     Head: Normocephalic and atraumatic.     Mouth/Throat:     Mouth: Mucous membranes are moist.     Pharynx: Oropharynx is clear.  Eyes:     General: No scleral icterus.    Conjunctiva/sclera: Conjunctivae normal.  Cardiovascular:     Rate and Rhythm: Normal rate and regular rhythm.     Heart sounds: Normal heart sounds. No murmur heard.    Comments: RRR without M/R/G. Pulmonary:     Effort: Pulmonary effort  is normal. No respiratory distress.     Breath sounds: Normal breath sounds.     Comments: CTAB, able to communicate without difficulty, without increased respiratory effort. Chest:     Chest wall: No tenderness.  Abdominal:     General: Abdomen is protuberant. Bowel sounds are normal. There is no distension.     Palpations: Abdomen is soft.     Tenderness: There is abdominal tenderness (Suprapubic/RUQ). There is no right CVA tenderness, left CVA tenderness or guarding. Negative signs include Murphy's sign and McBurney's sign.  Musculoskeletal:        General: No swelling.     Cervical back: Neck supple.     Comments: 5/5 ROM and strength of upper extremities bilaterally, and  appear neurovascularly intact.  Skin:    General: Skin is warm and dry.     Capillary Refill: Capillary refill takes less than 2 seconds.     Coloration: Skin is not cyanotic, jaundiced or pale.  Neurological:     Mental Status: He is alert and oriented to person, place, and time.  Psychiatric:        Mood and Affect: Mood normal.     ED Results / Procedures / Treatments   Labs (all labs ordered are listed, but only abnormal results are displayed) Labs Reviewed  COMPREHENSIVE METABOLIC PANEL - Abnormal; Notable for the following components:      Result Value   BUN 21 (*)    All other components within normal limits  LIPASE, BLOOD - Abnormal; Notable for the following components:   Lipase 70 (*)    All other components within normal limits  CBC WITH DIFFERENTIAL/PLATELET  URINALYSIS, ROUTINE W REFLEX MICROSCOPIC    EKG None  Radiology No results found.  Procedures Procedures    Medications Ordered in ED Medications - No data to display  ED Course/ Medical Decision Making/ A&P                           Medical Decision Making Amount and/or Complexity of Data Reviewed Labs: ordered.  Risk Prescription drug management.   31 y.o. male presents to the ED for concern of Abdominal Pain   This involves an extensive number of treatment options, and is a complaint that carries with it a high risk of complications and morbidity.  The emergent differential diagnosis prior to evaluation includes, but is not limited to: GERD, muscular strain, PUD, anxiousness  This is not an exhaustive differential.   Past Medical History / Co-morbidities / Social History: Hx of anxiety, depression, IBS, bipolar affective disorder, GERD, ADD, and pseudoseizures Social Determinants of Health include: None  Additional History:  Internal and external records from outside source obtained and reviewed including ED visits  Lab Tests: I ordered, and personally interpreted labs.  The  pertinent results include:   CBC, CMP, UA: Unremarkable Lipase: 70  Imaging Studies: None  ED Course: Pt well-appearing on exam.  Nontoxic, nonseptic, in NAD.  Sitting comfortably, appears clinically hydrated.  Complaining of mild intermittent epigastric discomfort since Wednesday.  Without any active symptoms for duration of encounter.  Known history of GERD.  Some relief with Tums and Pepcid, which may be suggestive of GERD or PUD.  No N/V/D.  No chest pain, shortness of breath, or significant cardiac history.  No recent upper respiratory symptoms.  Pain not worsened with inspiration.  Clinically does not suggest ACS, pneumonia, or pneumothorax at this time.  Pain not  worsened or initiated with extremity range of motion or palpation.  Less likely musculoskeletal related.  Lipase very mildly elevated at 70, however history and exam not suggestive of acute pancreatitis.  LFTs unremarkable and without RUQ tenderness, lower suspicion for hepatobiilary acute pathology.  Still passing stool, without any changes in bowel habits.  Lower suspicion of bowel obstruction or perforation.  Without any active discomfort. Upon reevaluation, patient remains unchanged.  Hemodynamically stable.  Labs appear overall unremarkable.  Considered imaging however, based on today's encounter, I feel this may be appropriately deferred at this time.  Overall, I am uncertain the exact etiology of the patient's symptoms.  However, I do not believe he is currently experiencing a medical, surgical, or psychiatric emergency.  Clinical suspicion for PUD or uncontrolled GERD.  Recommend 3-week trial of PPI and close follow-up with PCP for reevaluation and continued medical management.  Patient satisfied with today's encounter.  Patient in NAD and in good condition at time of discharge.  Disposition: After consideration the patient's encounter today, I do not feel today's workup suggests an emergent condition requiring admission or  immediate intervention beyond what has been performed at this time.  Safe for discharge; instructed to return immediately for worsening symptoms, change in symptoms or any other concerns.  I have reviewed the patients home medicines and have made adjustments as needed.  Discussed course of treatment with the patient, whom demonstrated understanding.  Patient in agreement and has no further questions.    I discussed this case with my attending physician Dr. Estell Harpin, who agreed with the proposed treatment course and cosigned this note including patient's presenting symptoms, physical exam, and planned diagnostics and interventions.  Attending physician stated agreement with plan or made changes to plan which were implemented.     This chart was dictated using voice recognition software.  Despite best efforts to proofread, errors can occur which can change the documentation meaning.         Final Clinical Impression(s) / ED Diagnoses Final diagnoses:  Epigastric discomfort  Gastroesophageal reflux disease, unspecified whether esophagitis present    Rx / DC Orders ED Discharge Orders          Ordered    pantoprazole (PROTONIX) 40 MG tablet  Daily        03/18/22 1259              Cecil Cobbs, [place] 03/21/22 1246    Bethann Berkshire, MD 03/23/22 1140

## 2022-03-18 NOTE — ED Notes (Signed)
Pt reports pain in center of chest while swallowing. States this feels like a "ball" and tightness in esophagus. With food and liquids pt states it is exacerbated. When swallowing normally he states that he still feels it but not as bad. Not associated with activity or deep breaths, only swallowing.

## 2022-03-18 NOTE — ED Triage Notes (Signed)
Pt to the ED with complaints of upper gastric pain since Wednesday.  Pt has used tums and pecid without relief.  Pt states he has had problems with acid reflux in the past and he is not on medication.

## 2022-11-02 ENCOUNTER — Encounter: Payer: Self-pay | Admitting: Radiology

## 2024-01-02 ENCOUNTER — Ambulatory Visit: Admitting: Psychology

## 2024-01-02 DIAGNOSIS — F411 Generalized anxiety disorder: Secondary | ICD-10-CM

## 2024-01-02 NOTE — Progress Notes (Addendum)
 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 He states that he has mild anxiety symptoms and wants strategy to manage.  Mental Status Exam: Appearance:   Casual     Behavior:  Appropriate  Motor:  Normal  Speech/Language:   Normal Rate  Affect:  Appropriate  Mood:  normal  Thought process:  normal  Thought content:    WNL  Sensory/Perceptual disturbances:    WNL  Orientation:  oriented to person, place, and situation  Attention:  Good  Concentration:  Good and Fair  Memory:  WNL  Fund of knowledge:   unknown  Insight:    unknown  Judgment:   Good  Impulse Control:  Good    Reported Symptoms:  States he will get periodically "nervous" and has palms sweat. Concentration slightly compromised  Risk Assessment: Danger to Self:  No Self-injurious Behavior: No Danger to Others: No Duty to Warn:no Physical Aggression / Violence:No  Access to Firearms a concern: No  Gang Involvement:No  Patient / guardian was educated about steps to take if suicide or homicide risk level increases between visits: n/a While future psychiatric events cannot be accurately predicted, the patient does not currently require acute inpatient psychiatric care and does not currently meet [place]  involuntary commitment criteria.  Substance Abuse History: Current substance abuse: No     Past Psychiatric History:   Previous psychological history is significant for ADHD, anxiety, and depression Outpatient Providers:Brandon Roberts, Ph.D. History of Psych Hospitalization: Yes  Psychological Testing: unknown   Abuse History:  Victim of: No., N/A   Report  needed: No. Victim of Neglect:No. Perpetrator of N/A  Witness / Exposure to Domestic Violence: unknown  Management consultant Involvement: No  Witness to [employer] Violence:  unknown  Family History:  Family History  Problem Relation Age of Onset   Hypertension Mother    Migraines Mother    Drug abuse Mother    Drug abuse Father    Drug abuse Paternal Grandfather     Living situation: the patient lives with an adult companion  Sexual Orientation: Straight  Relationship Status: co-habitating Name of spouse / other:Brandon Roberts If a parent, number of children / ages:three children ages 41,11,7.  Support Systems: N/A  Financial Stress:  No   Income/Employment/Disability: Employment  Financial planner: No   Educational History: Education: high school diploma/GED  Religion/Sprituality/World View: unknown  Any cultural differences that may affect / interfere with treatment:  not applicable   Recreation/Hobbies: non reported  Stressors: Other: none reported    Strengths: Family and Self Advocate  Barriers:  unknown   Legal History: Pending legal issue / charges: The patient has no significant history of legal issues. History of legal issue / charges: none reported  Medical History/Surgical History: reviewed Past Medical History:  Diagnosis Date   ADD (attention deficit disorder)    Anxiety    Bipolar disorder (HCC)    Depression    Irritable bowel syndrome (IBS)    Pseudoseizure    Syncopal episodes    being monitored for cardiac causation    Past Surgical History:  Procedure Laterality Date  heart monitor implant     SHOULDER SURGERY      Medications: Current Outpatient Medications  Medication Sig Dispense Refill   ALPRAZolam (XANAX) 1 MG tablet Take 1 mg by mouth 3 (three) times daily as needed. (Patient not taking: No sig reported)     dicyclomine (BENTYL) 20 MG tablet Take 20 mg by mouth every 6 (six) hours as needed for spasms. (Patient not taking:  Reported on 07/05/2021)     FLUoxetine  (PROZAC ) 20 MG capsule Take 20 mg by mouth daily. (Patient not taking: No sig reported)     gabapentin (NEURONTIN) 100 MG capsule Take 100 mg by mouth at bedtime. (Patient not taking: No sig reported)     indomethacin  (INDOCIN ) 25 MG capsule Take 1 capsule (25 mg total) by mouth 3 (three) times daily. (Patient not taking: No sig reported) 30 capsule 0   meloxicam  (MOBIC ) 15 MG tablet Take 1 tablet (15 mg total) by mouth daily. (Patient not taking: No sig reported) 30 tablet 2   pantoprazole  (PROTONIX ) 40 MG tablet Take 1 tablet (40 mg total) by mouth daily for 21 days. 21 tablet 0   predniSONE  (DELTASONE ) 10 MG tablet 12 day tapering dose (Patient not taking: No sig reported) 48 tablet 0   No current facility-administered medications for this visit.    No Known Allergies  Patient was seen in Provider's office.  Initial session: Patient states that his doctor told him he has to have a session to determine if he is stable to drive. 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 He thought that as long as he was coming in for clearance, he would continue to have some sessions. Says he has been taking Adderall for the past few months to help him focus on the job. Prescribed 2 times/day, but usually takes it one time/day. States that he has anxiety at times, both at work and when not working. It is not extreme, but it bothers him. This is not new to him. He feels he has come a long way from a few years ago. The anger he had in the past is no longer a factor. Has 3 children. Brandon Roberts is 84, Brandon Roberts is 13 and Brandon Roberts is 7. He is engaged and lives with the mother of Brandon Roberts and Brandon Roberts (Brandon Roberts). Says kids are doing well and he has a good relationship with each. Brandon Roberts works part time and is in school for nursing. 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 Likes his job and hasn't had any accidents or problems. States the only psychiatric  med he takes is the Adderral. Takes Gabapentin for shoulder pain, Katoralac (as needed). Denies any depression or suicidal ideation. Reports that he has periods of anxiety (5 on a scale of 10). He is able the anxiety with cognitive strategies. He says that "thinking positive" helps him and reflects on how far he has come. When anxious, says his hands get sweaty. He sometimes gets hard on himself, but knows he has lots of reasons to be proud of himself.  When not working he works around [employer]. He lift weights to exercise, but no cardio.  FOO: No contact with father and talks with mother every day. Good relationship with sibs (1 brother and sister). No contact with brother and sister in [place], who live there with his father.  Wants to have some sessions to address behavioral strategies for anxiety. Agreed to start with monthly  meetings.    Diagnoses:  Generalized Anxiety   Plan of Care/Tx. Plan and goals: Patient states he would like to engage in monthly outpatient therapy to address his episodes of anxiety. Will utilize behavioral strategies to reduce symptoms of subjective worry, agitation and perspiration. Goal date is 10-25. Will explore current symptoms and associated circumstances as well as current methods to manage. Patient is aware of and agrees with plan of treatment.    Jola Nash, PhD 8:30a-9:30a 60 minutes.

## 2024-01-07 ENCOUNTER — Ambulatory Visit: Admitting: Neurology

## 2024-01-07 ENCOUNTER — Encounter: Payer: Self-pay | Admitting: Neurology

## 2024-01-07 VITALS — BP 128/82 | Ht 66.0 in | Wt 186.0 lb

## 2024-01-07 DIAGNOSIS — Z87898 Personal history of other specified conditions: Secondary | ICD-10-CM | POA: Diagnosis not present

## 2024-01-07 DIAGNOSIS — R569 Unspecified convulsions: Secondary | ICD-10-CM

## 2024-01-07 NOTE — Patient Instructions (Addendum)
 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

## 2024-01-07 NOTE — Progress Notes (Signed)
 [facility]  PATIENT: Brandon Roberts DOB: 02/26/1991  REQUESTING CLINICIAN: Ander Bame, FNP HISTORY FROM: Patient  REASON FOR VISIT: Need DOT Clearance    HISTORICAL  CHIEF COMPLAINT:  Chief Complaint  Patient presents with   New Patient (Initial Visit)    Rm 12, NP, Pseudosz, needing DOT clearance, last know seizure was 2011    HISTORY OF PRESENT ILLNESS:  This is a 33 year old gentleman past medical history of shoulder pain, ADHD, who is presenting needing DOT clearance.  Patient tells me he has a history of seizure like activity diagnosed in 2010.  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  He was told that his seizure was stress-induced, he tells me that he was previously on Depakote  and levetiracetam, (these are the medication that he can remember) and since the diagnosis of pseudoseizure, these antiseizure medications were discontinued.  He reports not having a seizure and not being on antiseizure medication since 2010.  Denies any additional events.  He tells me currently he has right shoulder pain that he takes gabapentin intermittently and takes Adderall for ADHD.  No other complaints, or other concerns.  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    OTHER MEDICAL CONDITIONS: History of pseudoseizure, Shoulder pain, ADHD   REVIEW OF SYSTEMS: Full 14 system review of systems performed and negative with exception of: As noted in the HPI   ALLERGIES: No Known Allergies  HOME MEDICATIONS: Outpatient Medications Prior to Visit  Medication Sig Dispense Refill   amphetamine-dextroamphetamine (ADDERALL) 15 MG tablet Take 15 mg by mouth 2 (two) times daily.     gabapentin (NEURONTIN) 100 MG capsule Take 100 mg by mouth at bedtime.     ALPRAZolam (XANAX) 1 MG tablet Take 1 mg by mouth 3 (three) times daily as needed. (Patient not taking: Reported on 07/05/2021)     dicyclomine  (BENTYL) 20 MG tablet Take 20 mg by mouth every 6 (six) hours as needed for spasms. (Patient not taking: Reported on 07/05/2021)     FLUoxetine  (PROZAC ) 20 MG capsule Take 20 mg by mouth daily. (Patient not taking: Reported on 07/05/2021)     indomethacin  (INDOCIN ) 25 MG capsule Take 1 capsule (25 mg total) by mouth 3 (three) times daily. (Patient not taking: Reported on 07/05/2021) 30 capsule 0   meloxicam  (MOBIC ) 15 MG tablet Take 1 tablet (15 mg total) by mouth daily. (Patient not taking: Reported on 07/05/2021) 30 tablet 2   pantoprazole  (PROTONIX ) 40 MG tablet Take 1 tablet (40 mg total) by mouth daily for 21 days. 21 tablet 0   predniSONE  (DELTASONE ) 10 MG tablet 12 day tapering dose (Patient not taking: Reported on 07/05/2021) 48 tablet 0   No facility-administered medications prior to visit.    PAST MEDICAL HISTORY: Past Medical History:  Diagnosis Date   ADD (attention deficit disorder)    Anxiety    Bipolar disorder (HCC)    Depression    Irritable bowel syndrome (IBS)    Pseudoseizure    Syncopal episodes    being monitored for cardiac causation    PAST SURGICAL HISTORY: Past Surgical History:  Procedure Laterality Date   heart monitor implant     SHOULDER SURGERY      FAMILY HISTORY: Family History  Problem Relation Age of Onset   Hypertension Mother    Migraines Mother    Drug abuse Mother  Drug abuse Father    Drug abuse Paternal Grandfather     SOCIAL HISTORY: Social History   Socioeconomic History   Marital status: Single    Spouse name: Not on file   Number of children: Not on file   Years of education: Not on file   Highest education level: Not on file  Occupational History   Not on file  Tobacco Use   Smoking status: Never   Smokeless tobacco: Never  Vaping Use   Vaping status: Never Used  Substance and Sexual Activity   Alcohol use: Not Currently    Comment: rarely   Drug use: Not Currently    Types: Marijuana   Sexual activity: Yes     Birth control/protection: Condom  Other Topics Concern   Not on file  Social History Narrative   Right handed   Caffeine- none   [employer]vier for [employer]   Social Drivers of Health   Financial Resource Strain: Not on file  Food Insecurity: Not on file  Transportation Needs: Not on file  Physical Activity: Not on file  Stress: Not on file  Social Connections: Not on file  Intimate Partner Violence: Not on file    PHYSICAL EXAM  GENERAL EXAM/CONSTITUTIONAL: Vitals:  Vitals:   01/07/24 1438  BP: 128/82  Weight: 186 lb (84.4 kg)  Height: 5\' 6"  (1.676 m)   Body mass index is 30.02 kg/m. Wt Readings from Last 3 Encounters:  01/07/24 186 lb (84.4 kg)  03/18/22 185 lb (83.9 kg)  07/05/21 165 lb (74.8 kg)   Patient is in no distress; well developed, nourished and groomed; neck is supple  MUSCULOSKELETAL: Gait, strength, tone, movements noted in Neurologic exam below  NEUROLOGIC: MENTAL STATUS:      No data to display         awake, alert, oriented to person, place and time recent and remote memory intact normal attention and concentration language fluent, comprehension intact, naming intact fund of knowledge appropriate  CRANIAL NERVE:  2nd, 3rd, 4th, 6th - Visual fields full to confrontation, extraocular muscles intact, no nystagmus 5th - facial sensation symmetric 7th - facial strength symmetric 8th - hearing intact 9th - palate elevates symmetrically, uvula midline 11th - shoulder shrug symmetric 12th - tongue protrusion midline  MOTOR:  normal bulk and tone, full strength in the BUE, BLE  SENSORY:  normal and symmetric to light touch  COORDINATION:  finger-nose-finger, fine finger movements normal  GAIT/STATION:  normal    DIAGNOSTIC DATA (LABS, IMAGING, TESTING) - I reviewed patient records, labs, notes, testing and imaging myself where available.  Lab Results  Component Value Date   WBC 7.3 03/18/2022   HGB 15.9 03/18/2022   HCT 46.3  03/18/2022   MCV 91.3 03/18/2022   PLT 231 03/18/2022      Component Value Date/Time   NA 136 03/18/2022 1132   K 4.3 03/18/2022 1132   CL 102 03/18/2022 1132   CO2 27 03/18/2022 1132   GLUCOSE 99 03/18/2022 1132   BUN 21 (H) 03/18/2022 1132   CREATININE 0.89 03/18/2022 1132   CALCIUM 9.4 03/18/2022 1132   PROT 7.5 03/18/2022 1132   ALBUMIN 4.3 03/18/2022 1132   AST 23 03/18/2022 1132   ALT 34 03/18/2022 1132   ALKPHOS 103 03/18/2022 1132   BILITOT 0.7 03/18/2022 1132   GFRNONAA >60 03/18/2022 1132   GFRAA >60 02/11/2020 0825   Lab Results  Component Value Date   CHOL 176 01/10/2010   HDL 70.40  01/10/2010   LDLCALC 88 01/10/2010   TRIG 89.0 01/10/2010   No results found for: "HGBA1C" No results found for: "VITAMINB12" Lab Results  Component Value Date   TSH 0.945 12/11/2012   CT head 2011 No acute or significant findings   EEG LTM 2011 Summary/Impression:  During the course of this 1-day admission, the interictal EEG showed normal background at 9Hz  with no focal slowing or epileptiform abnormalities.  There were 2 clinical events.  The first consisted of unresponsiveness with brief episodes of right upper extremity shaking.  The second event consisted of low amplitude, high frequency, rapid leg jerking that rapidly progressed to involving the arms.  The jerking was asynchronous.  His eyes remained closed throughout all events and he had prolonged periods of unresponsiveness after both.  Neither event developed EEG correlate.    Conclusions:  Based on the above clinical and EEG data, the patients events are likely to be nonepileptic. These findings were discussed in detail with the patient. The patient was not started on any medications.  He will follow-up with his outside neurologist.   I personally reviewed brain Images and previous EEG reports.   ASSESSMENT AND PLAN  33 y.o. year old male  with history of nonepileptic events, last event in 2011, off antiseizure  medications for more than 10 years who is presenting needing DOT clearance.  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  Since patient last event was in 2011, off antiseizure medication for more than 10 years, he may resume driving with his CDL. Continue to follow PCP and return as needed.   1. History of psychogenic nonepileptic seizure   2. Seizure-like activity [facility])     Patient Instructions  Continue current medications  History of nonepileptic event, last event was in 2011 Patient last event is more than 10 years, he has been off antiseizure medication for more than 10 years Continue follow-up PCP return as needed.   Per [place]  DMV statutes, patients with seizures are not allowed to drive until they have been seizure-free for six months.  Other recommendations include using caution when using heavy equipment or power tools. Avoid working on ladders or at heights. Take showers instead of baths.  Do not swim alone.  Ensure the water temperature is not too high on the home water heater. Do not go swimming alone. Do not lock yourself in a room alone (i.e. bathroom). When caring for infants or small children, sit down when holding, feeding, or changing them to minimize risk of injury to the child in the event you have a seizure. Maintain good sleep hygiene. Avoid alcohol.  Also recommend adequate sleep, hydration, good diet and minimize stress.   During the Seizure  - First, ensure adequate ventilation and place patients on the floor on their left side  Loosen clothing around the neck and ensure the airway is patent. If the patient is clenching the teeth, do not force the mouth open with any object as this can cause severe damage - Remove all items from the surrounding that can be hazardous. The patient may be oblivious to what's happening  and may not even know what he or she is doing. If the patient is confused and wandering, either gently guide him/her away and block access to outside areas - Reassure the individual and be comforting -  Call 911. In most cases, the seizure ends before EMS arrives. However, there are cases when seizures may last over 3 to 5 minutes. Or the individual may have developed breathing difficulties or severe injuries. If a pregnant patient or a person with diabetes develops a seizure, it is prudent to call an ambulance. - Finally, if the patient does not regain full consciousness, then call EMS. Most patients will remain confused for about 45 to 90 minutes after a seizure, so you must use judgment in calling for help. - Avoid restraints but make sure the patient is in a bed with padded side rails - Place the individual in a lateral position with the neck slightly flexed; this will help the saliva drain from the mouth and prevent the tongue from falling backward - Remove all nearby furniture and other hazards from the area - Provide verbal assurance as the individual is regaining consciousness - Provide the patient with privacy if possible - Call for help and start treatment as ordered by the caregiver   After the Seizure (Postictal Stage)  After a seizure, most patients experience confusion, fatigue, muscle pain and/or a headache. Thus, one should permit the individual to sleep. For the next few days, reassurance is essential. Being calm and helping reorient the person is also of importance.  Most seizures are painless and end spontaneously. Seizures are not harmful to others but can lead to complications such as stress on the lungs, brain and the heart. Individuals with prior lung problems may develop labored breathing and respiratory distress.    Discussed Patients with epilepsy have a small risk of sudden unexpected death, a condition referred to as sudden unexpected death in epilepsy (SUDEP). SUDEP is  defined specifically as the sudden, unexpected, witnessed or unwitnessed, nontraumatic and nondrowning death in patients with epilepsy with or without evidence for a seizure, and excluding documented status epilepticus, in which post mortem examination does not reveal a structural or toxicologic cause for death     No orders of the defined types were placed in this encounter.   No orders of the defined types were placed in this encounter.   Return if symptoms worsen or fail to improve.    Cassandra Cleveland, MD 01/07/2024, 3:20 PM  [facility] [address] [place], [place] [address] [phone]

## 2024-01-25 ENCOUNTER — Ambulatory Visit: Admitting: Psychology
